# Patient Record
Sex: Male | Born: 1940 | Race: White | Hispanic: No | Marital: Married | Smoking: Former smoker
Health system: Southern US, Community
[De-identification: ages and names within clinical notes are randomized; demographics above are authoritative.]

## PROBLEM LIST (undated history)

## (undated) DIAGNOSIS — C61 Malignant neoplasm of prostate: Secondary | ICD-10-CM

## (undated) HISTORY — DX: Malignant neoplasm of prostate: C61

---
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
## 2002-03-01 ENCOUNTER — Ambulatory Visit (HOSPITAL_COMMUNITY): Admission: RE | Admit: 2002-03-01 | Discharge: 2002-03-01 | Payer: Self-pay | Admitting: Gastroenterology

## 2002-03-01 ENCOUNTER — Encounter (INDEPENDENT_AMBULATORY_CARE_PROVIDER_SITE_OTHER): Payer: Self-pay | Admitting: *Deleted

## 2007-12-01 DIAGNOSIS — C61 Malignant neoplasm of prostate: Secondary | ICD-10-CM

## 2007-12-01 HISTORY — DX: Malignant neoplasm of prostate: C61

## 2008-08-10 ENCOUNTER — Emergency Department (HOSPITAL_COMMUNITY): Admission: EM | Admit: 2008-08-10 | Discharge: 2008-08-10 | Payer: Self-pay | Admitting: Emergency Medicine

## 2008-09-18 ENCOUNTER — Ambulatory Visit: Admission: RE | Admit: 2008-09-18 | Discharge: 2008-10-02 | Payer: Self-pay | Admitting: Radiation Oncology

## 2009-12-17 ENCOUNTER — Emergency Department (HOSPITAL_COMMUNITY): Admission: EM | Admit: 2009-12-17 | Discharge: 2009-12-17 | Payer: Self-pay | Admitting: Emergency Medicine

## 2009-12-24 IMAGING — CT CT ANGIO CHEST
4 of 7 series · 13 of 30 positions shown · IV contrast (80 ml omni 300)
Comparison: None.

CLINICAL DATA: Chest pain

CT ANGIOGRAPHY CHEST
TECHNIQUE: Multidetector CT imaging of the chest using the
standard protocol during bolus administration of intravenous
contrast. Multiplanar reconstructed images obtained and reviewed to
evaluate the vascular anatomy.
Contrast: 80 ml [id number]

[Series 2: pe · axial · 0.73mm/px · z∈[-236,-64]mm · 6 of 208 slices shown]
[im 35/208  lung]
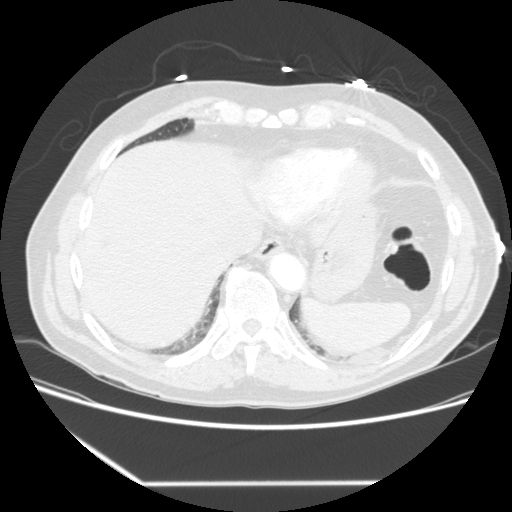
[im 70/208  mediastinal]
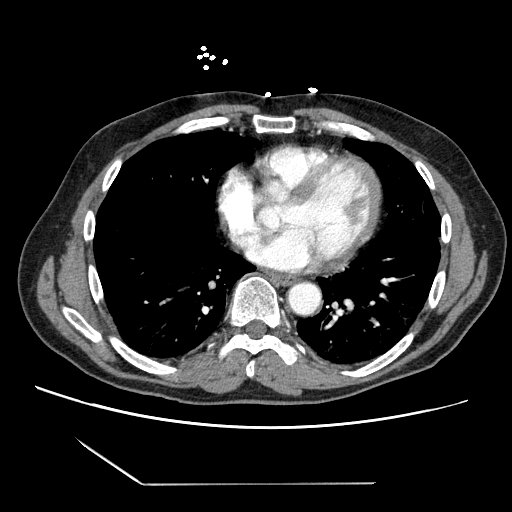
[im 104/208  lung]
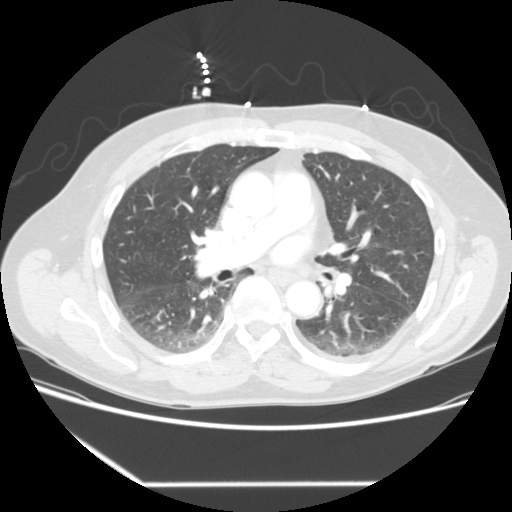
[im 121/208  mediastinal]
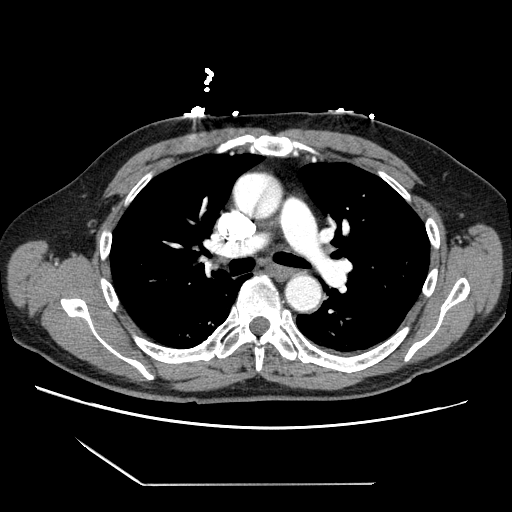
[im 139/208  lung]
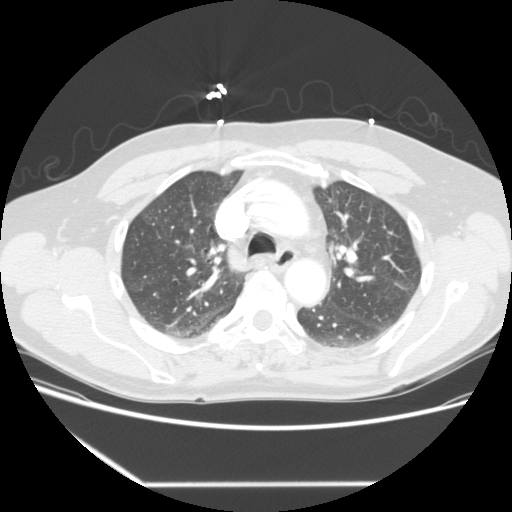
[im 173/208  mediastinal]
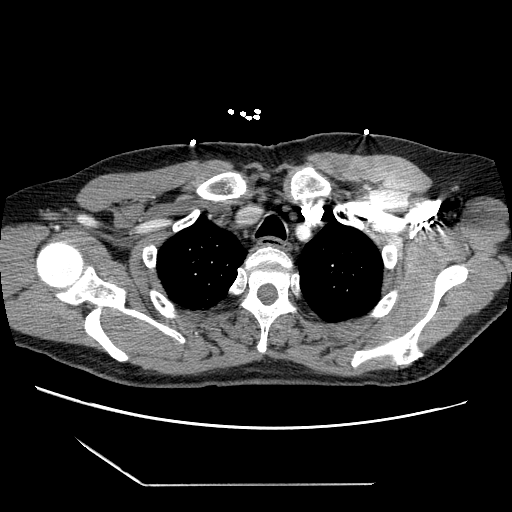

[Series 3: recon 2: pe · axial · 0.73mm/px · z∈[-192,-107]mm · 3 of 104 slices shown]
[im 35/104  lung]
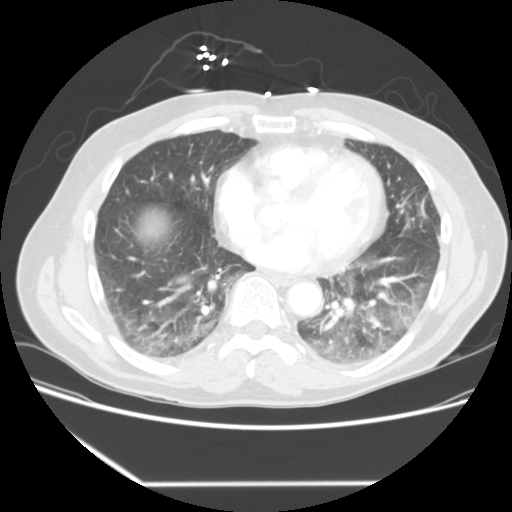
[im 61/104  lung]
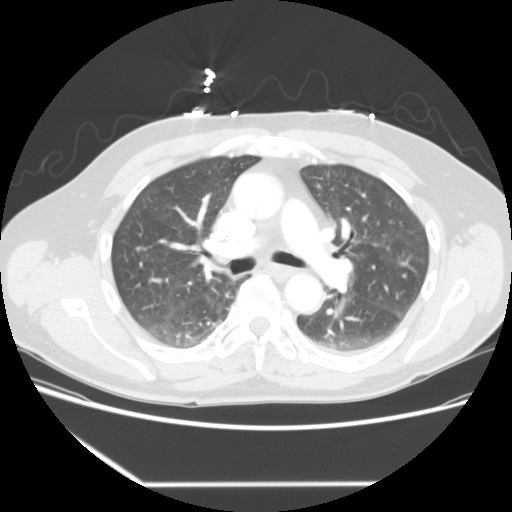
[im 69/104  lung]
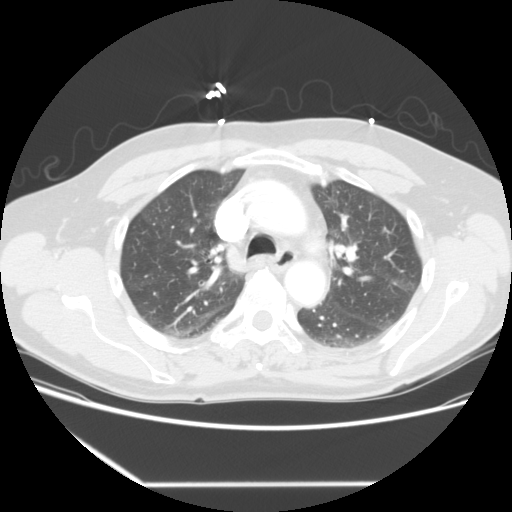

[Series 201: reformatted · sagittal · 0.73mm/px · 2 of 104 slices shown (1 of 2)]
[im 35/104  lung]
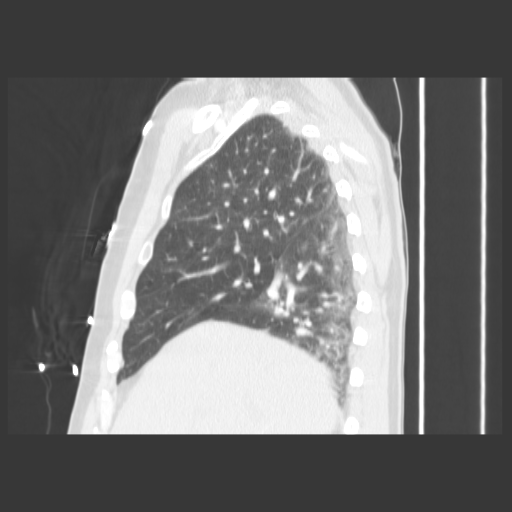
[im 69/104  lung]
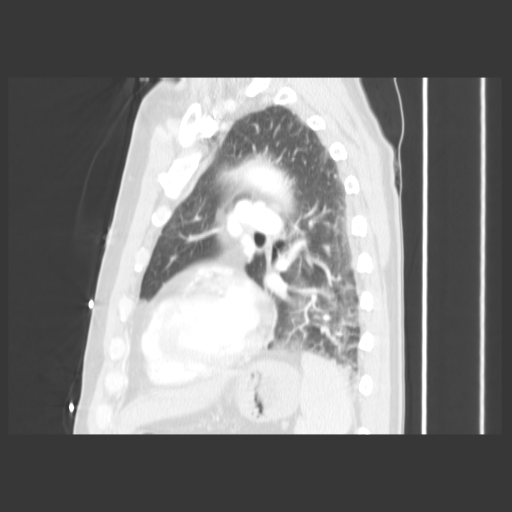

[Series 202: reformatted · coronal · 0.73mm/px · 2 of 100 slices shown (2 of 2)]
[im 34/100  lung]
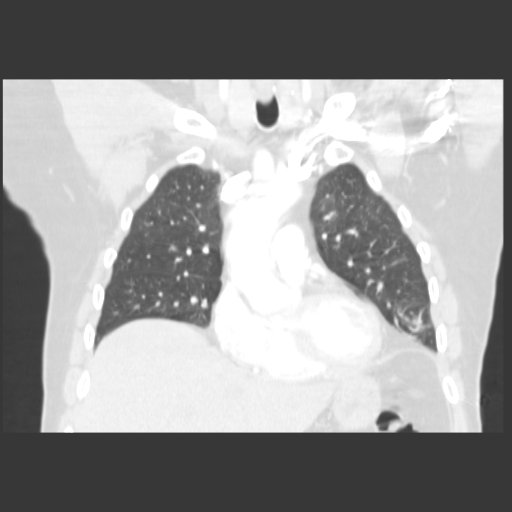
[im 67/100  lung]
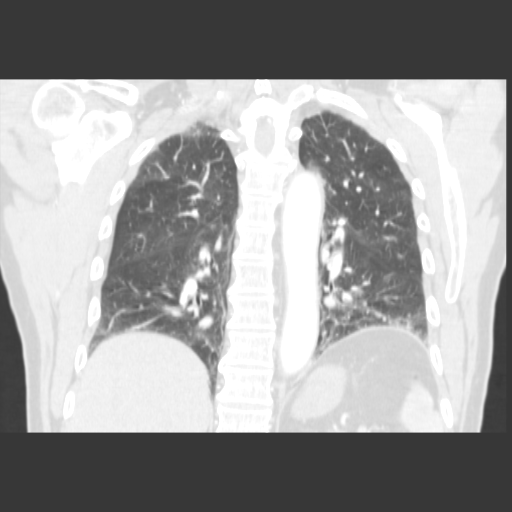

[13 of 30 positions shown; findings below may reference images not displayed]

FINDINGS: Respiratory motion in the lower lobes degrades image
quality of the subsegmental basilar pulmonary arteries.  Within
this limitation, no acute pulmonary embolus is evident.

There is no axillary, mediastinal, or hilar lymphadenopathy.
Transverse aorta measures up to about 2.8 cm in diameter.  Heart
size is normal.  There is no pericardial or pleural effusion.

Lung windows show compressive atelectasis in both lower lobes.  No
evidence for dense airspace consolidation.

Images including upper abdomen shows scattered tiny hypodensities
in the liver, too small to characterize, but probably benign
finding such as cysts.
IMPRESSION: No CT evidence for acute pulmonary embolus.

Bibasilar atelectasis without dense focal airspace consolidation.

## 2011-05-02 IMAGING — CT CT HEAD W/O CM
1 series · 16 of 30 positions shown, 20 images · non-contrast
Comparison: None

CLINICAL DATA: Blunt trauma post fall, headache

CT HEAD WITHOUT CONTRAST
TECHNIQUE: Contiguous axial images were obtained from the base of
the skull through the vertex without contrast.

[Series 2: head trauma 4.8 h37s · axial · 0.44mm/px · z∈[+1205,+1338]mm · 16 of 30 slices shown, 20 images]
[im 2/30  brain]
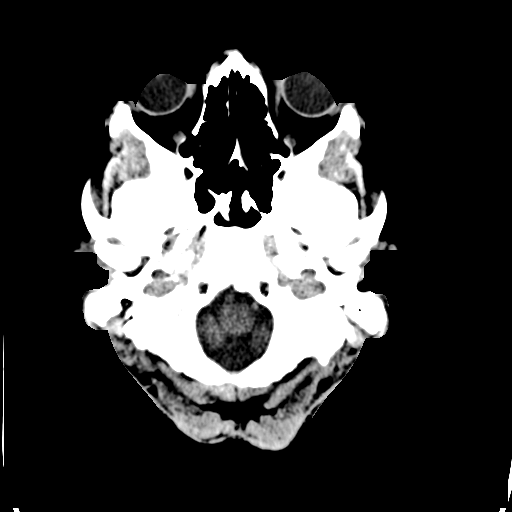
[im 2/30  bone]
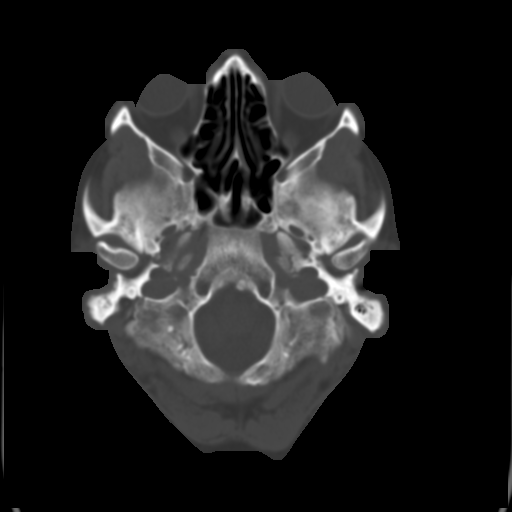
[im 4/30  brain]
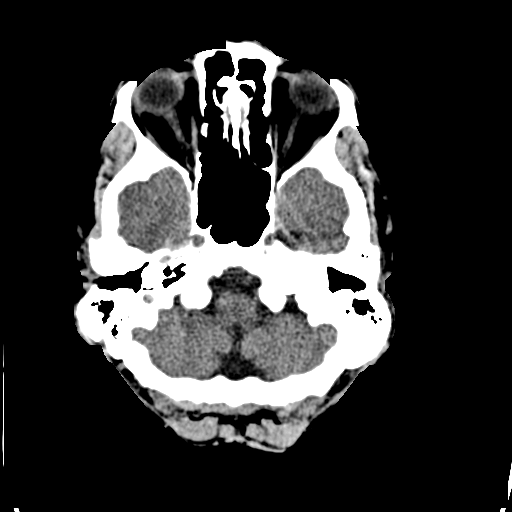
[im 6/30  brain]
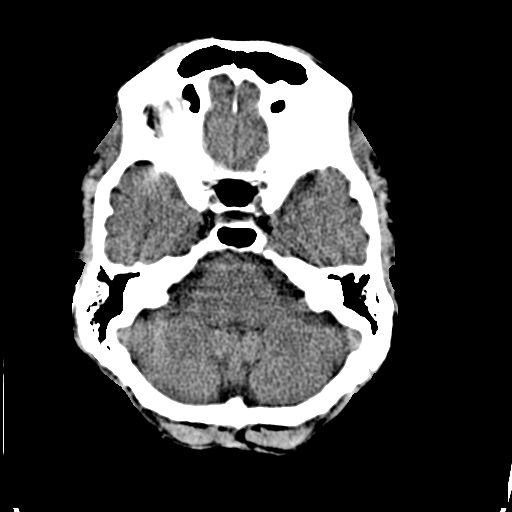
[im 8/30  brain]
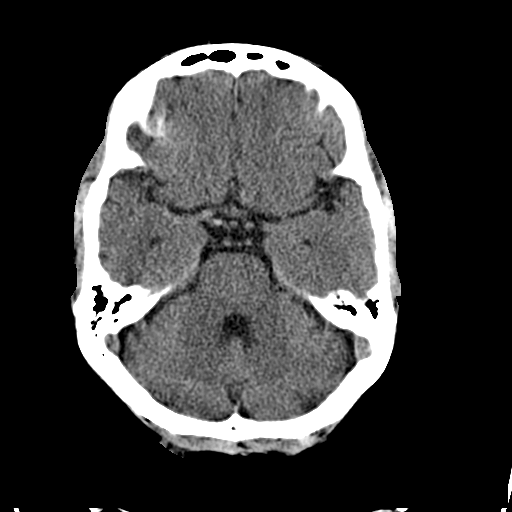
[im 9/30  brain]
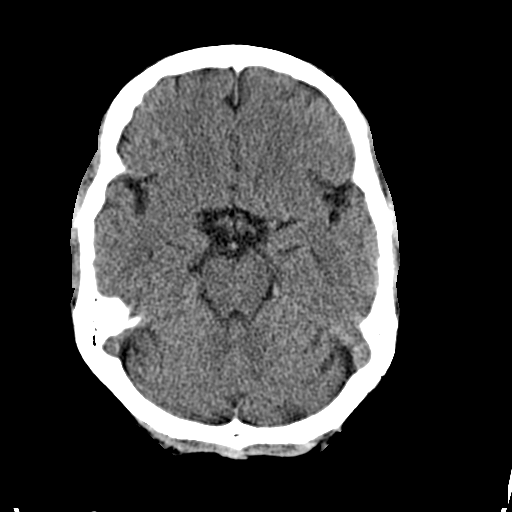
[im 9/30  bone]
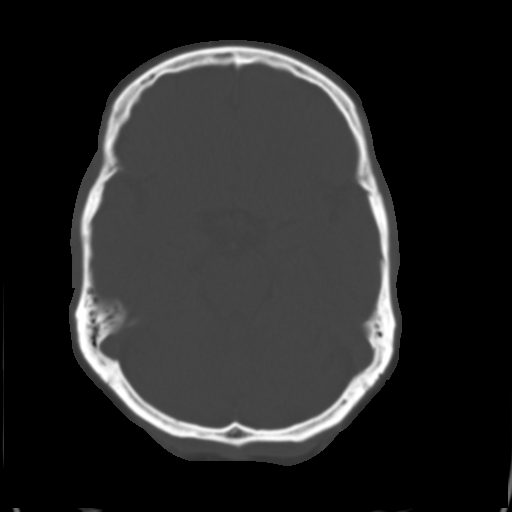
[im 11/30  brain]
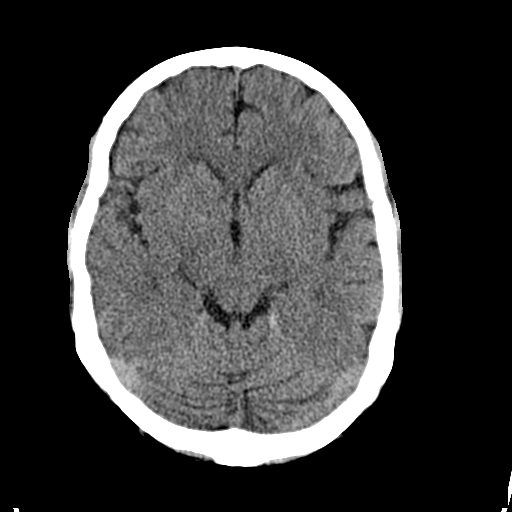
[im 13/30  brain]
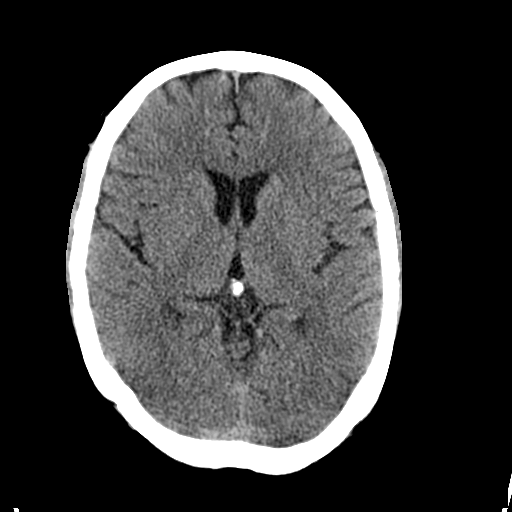
[im 15/30  brain]
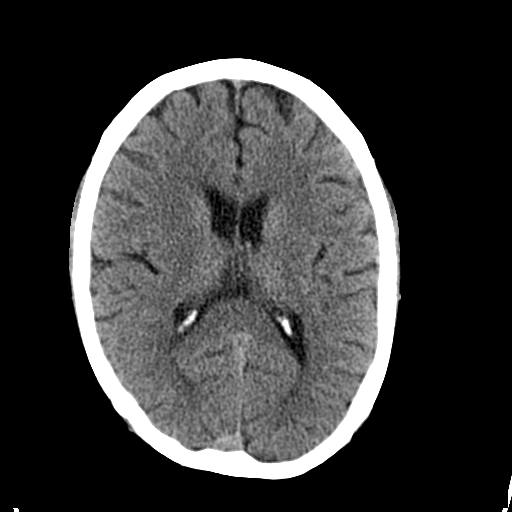
[im 16/30  brain]
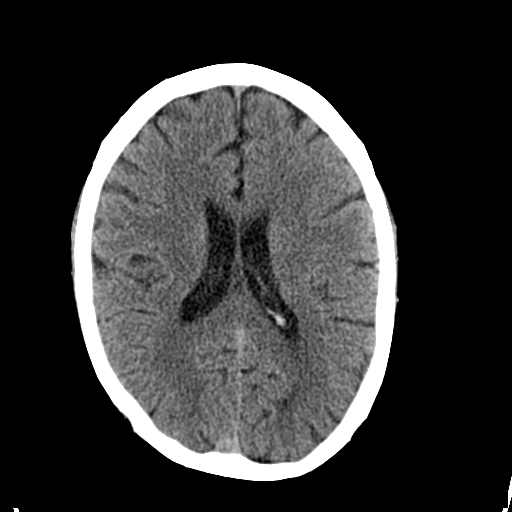
[im 16/30  bone]
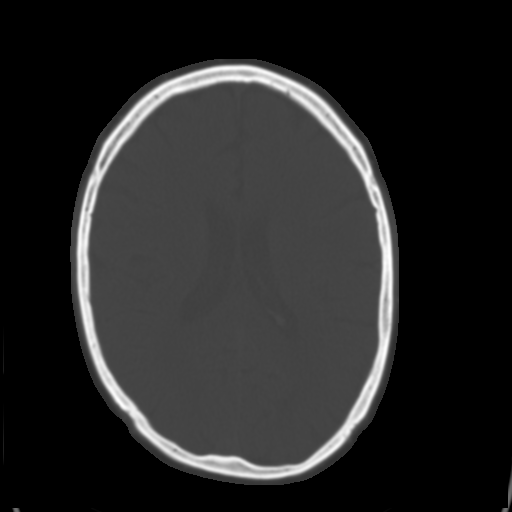
[im 18/30  brain]
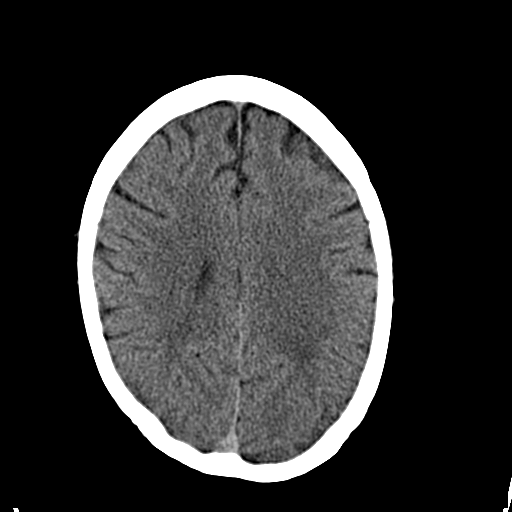
[im 20/30  brain]
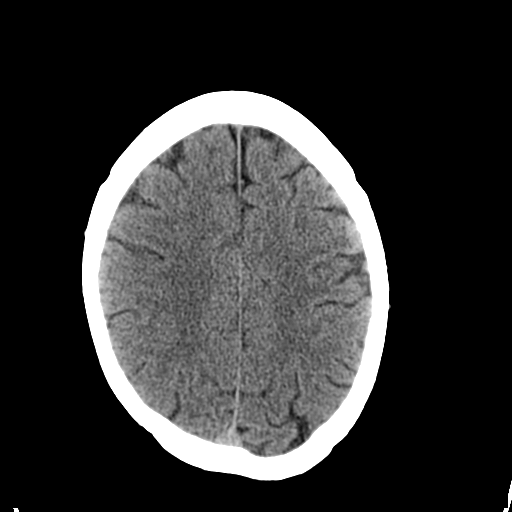
[im 22/30  brain]
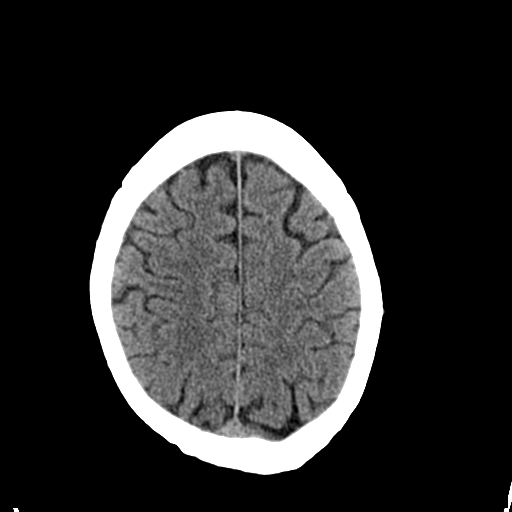
[im 23/30  brain]
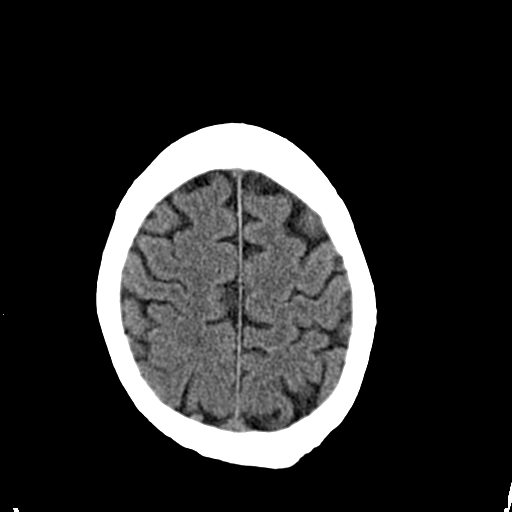
[im 23/30  bone]
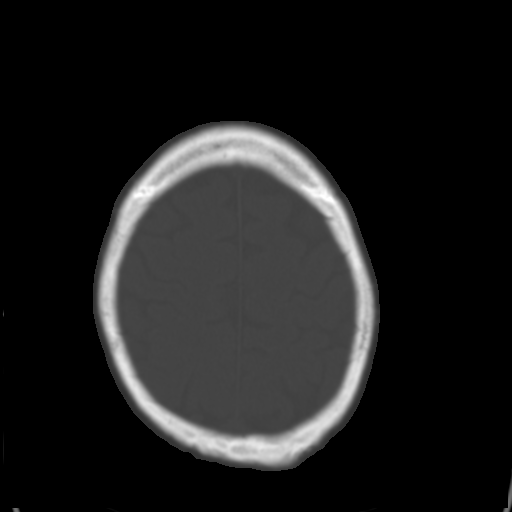
[im 25/30  brain]
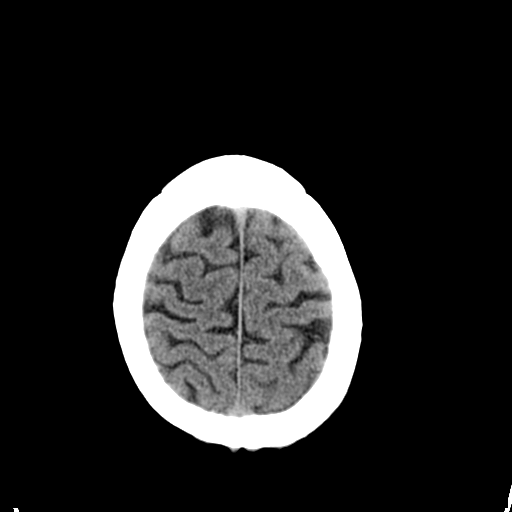
[im 27/30  brain]
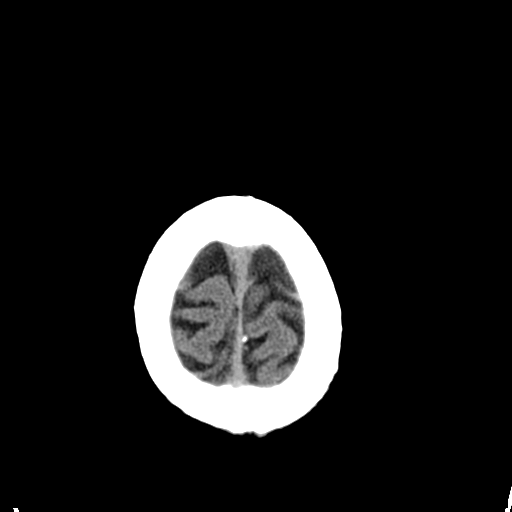
[im 29/30  brain]
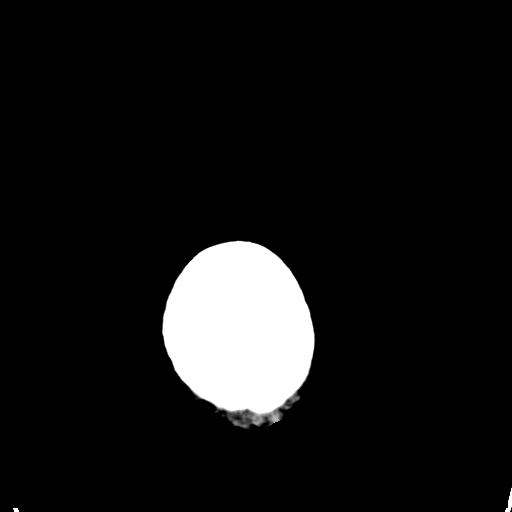

[16 of 30 positions shown; findings below may reference images not displayed]

FINDINGS: Mild atrophy. There is left posterior parietal scalp soft
tissue swelling. There is no evidence of acute intracranial
hemorrhage, brain edema, mass lesion, acute infarction,   mass
effect, or midline shift. Acute infarct may be inapparent on
noncontrast CT.  No other intra-axial abnormalities are seen, and
the ventricles and sulci are within normal limits in size and
symmetry.   No abnormal extra-axial fluid collections or masses are
identified.  No significant calvarial abnormality.
IMPRESSION: Negative for bleed or other acute intracranial process.

## 2011-09-02 LAB — DIFFERENTIAL
Basophils Absolute: 0
Eosinophils Relative: 1
Monocytes Absolute: 0.7
Neutro Abs: 6.9
Neutrophils Relative %: 76

## 2011-09-02 LAB — POCT I-STAT, CHEM 8
Calcium, Ion: 1.09 — ABNORMAL LOW
HCT: 41
Hemoglobin: 13.9
Sodium: 136
TCO2: 29

## 2011-09-02 LAB — POCT CARDIAC MARKERS
CKMB, poc: 1
CKMB, poc: <1 — ABNORMAL LOW
Myoglobin, poc: 54.6
Troponin i, poc: <0.05

## 2011-09-02 LAB — CBC
HCT: 41.2
Hemoglobin: 13.8
RDW: 12.9
WBC: 9.2

## 2012-02-15 ENCOUNTER — Emergency Department (HOSPITAL_COMMUNITY)
Admission: EM | Admit: 2012-02-15 | Discharge: 2012-02-15 | Disposition: A | Discharge status: Home / Self Care | Payer: Medicare Other | Attending: Emergency Medicine | Admitting: Emergency Medicine

## 2012-02-15 ENCOUNTER — Encounter (HOSPITAL_COMMUNITY): Payer: Self-pay

## 2012-02-15 DIAGNOSIS — IMO0001 Reserved for inherently not codable concepts without codable children: Secondary | ICD-10-CM | POA: Insufficient documentation

## 2012-02-15 DIAGNOSIS — Z79899 Other long term (current) drug therapy: Secondary | ICD-10-CM | POA: Insufficient documentation

## 2012-02-15 DIAGNOSIS — R197 Diarrhea, unspecified: Secondary | ICD-10-CM | POA: Insufficient documentation

## 2012-02-15 DIAGNOSIS — R112 Nausea with vomiting, unspecified: Secondary | ICD-10-CM | POA: Insufficient documentation

## 2012-02-15 MED ORDER — SODIUM CHLORIDE 0.9 % IV BOLUS (SEPSIS)
1000.0000 mL | Freq: Once | INTRAVENOUS | Status: AC
  Administered 2012-02-15: 1000 mL via INTRAVENOUS

## 2012-02-15 MED ORDER — ONDANSETRON HCL 4 MG/2ML IJ SOLN
4.0000 mg | Freq: Once | INTRAMUSCULAR | Status: AC
  Administered 2012-02-15: 4 mg via INTRAVENOUS
  Filled 2012-02-15: qty 2

## 2012-02-15 MED ORDER — ONDANSETRON 8 MG PO TBDP: 8.0000 mg | ORAL_TABLET | Freq: Three times a day (TID) | ORAL | Status: AC | PRN

## 2012-02-15 MED ORDER — LOPERAMIDE HCL 2 MG PO CAPS
4.0000 mg | ORAL_CAPSULE | Freq: Once | ORAL | Status: AC
  Administered 2012-02-15: 4 mg via ORAL
  Filled 2012-02-15: qty 2

## 2012-02-15 NOTE — ED Notes (Signed)
Pt states n/v/d since yesterday has not been able to eat. States feels really bad

## 2012-02-15 NOTE — ED Provider Notes (Signed)
History     CSN: [id number]  Arrival date & time 02/15/12  1003   First MD Initiated Contact with Patient 02/15/12 1023      Chief Complaint  Patient presents with  . Nausea  . Emesis  . Diarrhea    (Consider location/radiation/quality/duration/timing/severity/associated sxs/prior treatment) Patient is a 71 y.o. male presenting with vomiting and diarrhea. The history is provided by the patient.  Emesis  This is a new problem. Associated symptoms include diarrhea and myalgias. Pertinent negatives include no abdominal pain and no headaches.  Diarrhea The primary symptoms include nausea, vomiting, diarrhea and myalgias. Primary symptoms do not include abdominal pain or rash.  The myalgias are not associated with weakness.  The illness does not include back pain.   nausea vomiting diarrhea began last night. Some mild abdominal cramping. No fevers. The lightheadedness dizziness. He states he does ache all over. No clear sick contacts, but he states that he went to the [facility] basketball tournament yesterday. No blood in emesis or stool.  History reviewed. No pertinent past medical history.  History reviewed. No pertinent past surgical history.  History reviewed. No pertinent family history.  History  Substance Use Topics  . Smoking status: Not on file  . Smokeless tobacco: Not on file  . Alcohol Use: Not on file      Review of Systems  Constitutional: Negative for activity change and appetite change.  HENT: Negative for neck stiffness.   Eyes: Negative for pain.  Respiratory: Negative for chest tightness and shortness of breath.   Cardiovascular: Negative for chest pain and leg swelling.  Gastrointestinal: Positive for nausea, vomiting and diarrhea. Negative for abdominal pain.  Genitourinary: Negative for flank pain.  Musculoskeletal: Positive for myalgias. Negative for back pain.  Skin: Negative for rash.  Neurological: Negative for weakness, numbness and headaches.    Psychiatric/Behavioral: Negative for behavioral problems.    Allergies  Review of patient's allergies indicates no known allergies.  Home Medications   Current Outpatient Rx  Name Route Sig Dispense Refill  . CALCIUM CARBONATE-VITAMIN D 500-200 MG-UNIT PO TABS Oral Take 1 tablet by mouth 2 (two) times daily.    Marland Kitchen DIPHENHYDRAMINE HCL 25 MG PO CAPS Oral Take 25 mg by mouth every 6 (six) hours as needed. For sleep    . OMEGA-3 FATTY ACIDS 1000 MG PO CAPS Oral Take 2 g by mouth 2 (two) times daily.    . IBUPROFEN 200 MG PO TABS Oral Take 400 mg by mouth every 6 (six) hours as needed. For pain    . LACTASE 3000 UNITS PO TABS Oral Take 1 tablet by mouth 3 (three) times daily as needed. With milk products    . ADULT MULTIVITAMIN W/MINERALS CH Oral Take 1 tablet by mouth daily.    . OCUVITE-LUTEIN PO CAPS Oral Take 1 capsule by mouth daily.    Marland Kitchen SALINE NASAL SPRAY 0.65 % NA SOLN Nasal Place 2 sprays into the nose as needed. For dry nose    . ONDANSETRON 8 MG PO TBDP Oral Take 1 tablet (8 mg total) by mouth every 8 (eight) hours as needed for nausea. 20 tablet 0    BP 152/75  Pulse 86  Temp(Src) 97.8 F (36.6 C) (Oral)  Resp 16  SpO2 100%  Physical Exam  Nursing note and vitals reviewed. Constitutional: He is oriented to person, place, and time. He appears well-developed and well-nourished.  HENT:  Head: Normocephalic and atraumatic.  Eyes: EOM are normal. Pupils are equal,  round, and reactive to light.  Neck: Normal range of motion. Neck supple.  Cardiovascular: Normal rate, regular rhythm and normal heart sounds.   No murmur heard. Pulmonary/Chest: Effort normal and breath sounds normal.  Abdominal: Soft. Bowel sounds are normal. He exhibits no distension and no mass. There is no tenderness. There is no rebound and no guarding.  Musculoskeletal: Normal range of motion. He exhibits no edema.  Neurological: He is alert and oriented to person, place, and time. No cranial nerve  deficit.  Skin: Skin is warm and dry.  Psychiatric: He has a normal mood and affect.    ED Course  Procedures (including critical care time)  Labs Reviewed - No data to display No results found.   1. Nausea vomiting and diarrhea       MDM  Nausea vomiting diarrhea. Patient does not appear overly dehydrated. He received a liter of fluid as tolerated orals and feels better. He will be discharged home.        Juliet Rude. Rubin Payor, MD 02/15/12 [phone]

## 2012-02-15 NOTE — Discharge Instructions (Signed)
Diet for Diarrhea, Adult Having frequent, runny stools (diarrhea) has many causes. Diarrhea may be caused or worsened by food or drink. Diarrhea may be relieved by changing your diet. IF YOU ARE NOT TOLERATING SOLID FOODS:  Drink enough water and fluids to keep your urine clear or pale yellow.   Avoid sugary drinks and sodas as well as milk-based beverages.   Avoid beverages containing caffeine and alcohol.   You may try rehydrating beverages. You can make your own by following this recipe:    tsp table salt.    tsp baking soda.   ? tsp salt substitute (potassium chloride).   1 tbs + 1 tsp sugar.   1 qt water.  As your stools become more solid, you can start eating solid foods. Add foods one at a time. If a certain food causes your diarrhea to get worse, avoid that food and try other foods. A low fiber, low-fat, and lactose-free diet is recommended. Small, frequent meals may be better tolerated.  Starches  Allowed:  White, French, and pita breads, plain rolls, buns, bagels. Plain muffins, matzo. Soda, saltine, or graham crackers. Pretzels, melba toast, zwieback. Cooked cereals made with water: cornmeal, farina, cream cereals. Dry cereals: refined corn, wheat, rice. Potatoes prepared any way without skins, refined macaroni, spaghetti, noodles, refined rice.   Avoid:  Bread, rolls, or crackers made with whole wheat, multi-grains, rye, bran seeds, nuts, or coconut. Corn tortillas or taco shells. Cereals containing whole grains, multi-grains, bran, coconut, nuts, or raisins. Cooked or dry oatmeal. Coarse wheat cereals, granola. Cereals advertised as "high-fiber." Potato skins. Whole grain pasta, wild or brown rice. Popcorn. Sweet potatoes/yams. Sweet rolls, doughnuts, waffles, pancakes, sweet breads.  Vegetables  Allowed: Strained tomato and vegetable juices. Most well-cooked and canned vegetables without seeds. Fresh: Tender lettuce, cucumber without the skin, cabbage, spinach, bean  sprouts.   Avoid: Fresh, cooked, or canned: Artichokes, baked beans, beet greens, broccoli, Brussels sprouts, corn, kale, legumes, peas, sweet potatoes. Cooked: Green or red cabbage, spinach. Avoid large servings of any vegetables, because vegetables shrink when cooked, and they contain more fiber per serving than fresh vegetables.  Fruit  Allowed: All fruit juices except prune juice. Cooked or canned: Apricots, applesauce, cantaloupe, cherries, fruit cocktail, grapefruit, grapes, kiwi, mandarin oranges, peaches, pears, plums, watermelon. Fresh: Apples without skin, ripe banana, grapes, cantaloupe, cherries, grapefruit, peaches, oranges, plums. Keep servings limited to  cup or 1 piece.   Avoid: Fresh: Apple with skin, apricots, mango, pears, raspberries, strawberries. Prune juice, stewed or dried prunes. Dried fruits, raisins, dates. Large servings of all fresh fruits.  Meat and Meat Substitutes  Allowed: Ground or well-cooked tender beef, ham, veal, lamb, pork, or poultry. Eggs, plain cheese. Fish, oysters, shrimp, lobster, other seafoods. Liver, organ meats.   Avoid: Tough, fibrous meats with gristle. Peanut butter, smooth or chunky. Cheese, nuts, seeds, legumes, dried peas, beans, lentils.  Milk  Allowed: Yogurt, lactose-free milk, kefir, drinkable yogurt, buttermilk, soy milk.   Avoid: Milk, chocolate milk, beverages made with milk, such as milk shakes.  Soups  Allowed: Bouillon, broth, or soups made from allowed foods. Any strained soup.   Avoid: Soups made from vegetables that are not allowed, cream or milk-based soups.  Desserts and Sweets  Allowed: Sugar-free gelatin, sugar-free frozen ice pops made without sugar alcohol.   Avoid: Plain cakes and cookies, pie made with allowed fruit, pudding, custard, cream pie. Gelatin, fruit, ice, sherbet, frozen ice pops. Ice cream, ice milk without nuts. Plain hard candy,   honey, jelly, molasses, syrup, sugar, chocolate syrup, gumdrops,  marshmallows.  Fats and Oils  Allowed: Avoid any fats and oils.   Avoid: Seeds, nuts, olives, avocados. Margarine, butter, cream, mayonnaise, salad oils, plain salad dressings made from allowed foods. Plain gravy, crisp bacon without rind.  Beverages  Allowed: Water, decaffeinated teas, oral rehydration solutions, sugar-free beverages.   Avoid: Fruit juices, caffeinated beverages (coffee, tea, soda or pop), alcohol, sports drinks, or lemon-lime soda or pop.  Condiments  Allowed: Ketchup, mustard, horseradish, vinegar, cream sauce, cheese sauce, cocoa powder. Spices in moderation: allspice, basil, bay leaves, celery powder or leaves, cinnamon, cumin powder, curry powder, ginger, mace, marjoram, onion or garlic powder, oregano, paprika, parsley flakes, ground pepper, rosemary, sage, savory, tarragon, thyme, turmeric.   Avoid: Coconut, honey.  Weight Monitoring: Weigh yourself every day. You should weigh yourself in the morning after you urinate and before you eat breakfast. Wear the same amount of clothing when you weigh yourself. Record your weight daily. Bring your recorded weights to your clinic visits. Tell your caregiver right away if you have gained 3 lb/1.4 kg or more in 1 day, 5 lb/2.3 kg in a week, or whatever amount you were told to report. SEEK IMMEDIATE MEDICAL CARE IF:   You are unable to keep fluids down.   You start to throw up (vomit) or diarrhea keeps coming back (persistent).   Abdominal pain develops, increases, or can be felt in one place (localizes).   You have an oral temperature above 102 F (38.9 C), not controlled by medicine.   Diarrhea contains blood or mucus.   You develop excessive weakness, dizziness, fainting, or extreme thirst.  MAKE SURE YOU:   Understand these instructions.   Will watch your condition.   Will get help right away if you are not doing well or get worse.  Document Released: 02/06/2004 Document Revised: 11/05/2011 Document Reviewed:  05/30/2009 [employer] Patient Information 2012 [employer].Nausea and Vomiting Nausea is a sick feeling that often comes before throwing up (vomiting). Vomiting is a reflex where stomach contents come out of your mouth. Vomiting can cause severe loss of body fluids (dehydration). Children and elderly adults can become dehydrated quickly, especially if they also have diarrhea. Nausea and vomiting are symptoms of a condition or disease. It is important to find the cause of your symptoms. CAUSES   Direct irritation of the stomach lining. This irritation can result from increased acid production (gastroesophageal reflux disease), infection, food poisoning, taking certain medicines (such as nonsteroidal anti-inflammatory drugs), alcohol use, or tobacco use.   Signals from the brain.These signals could be caused by a headache, heat exposure, an inner ear disturbance, increased pressure in the brain from injury, infection, a tumor, or a concussion, pain, emotional stimulus, or metabolic problems.   An obstruction in the gastrointestinal tract (bowel obstruction).   Illnesses such as diabetes, hepatitis, gallbladder problems, appendicitis, kidney problems, cancer, sepsis, atypical symptoms of a heart attack, or eating disorders.   Medical treatments such as chemotherapy and radiation.   Receiving medicine that makes you sleep (general anesthetic) during surgery.  DIAGNOSIS Your caregiver may ask for tests to be done if the problems do not improve after a few days. Tests may also be done if symptoms are severe or if the reason for the nausea and vomiting is not clear. Tests may include:  Urine tests.   Blood tests.   Stool tests.   Cultures (to look for evidence of infection).   X-rays or other imaging   studies.  Test results can help your caregiver make decisions about treatment or the need for additional tests. TREATMENT You need to stay well hydrated. Drink frequently but in small  amounts.You may wish to drink water, sports drinks, clear broth, or eat frozen ice pops or gelatin dessert to help stay hydrated.When you eat, eating slowly may help prevent nausea.There are also some antinausea medicines that may help prevent nausea. HOME CARE INSTRUCTIONS   Take all medicine as directed by your caregiver.   If you do not have an appetite, do not force yourself to eat. However, you must continue to drink fluids.   If you have an appetite, eat a normal diet unless your caregiver tells you differently.   Eat a variety of complex carbohydrates (rice, wheat, potatoes, bread), lean meats, yogurt, fruits, and vegetables.   Avoid high-fat foods because they are more difficult to digest.   Drink enough water and fluids to keep your urine clear or pale yellow.   If you are dehydrated, ask your caregiver for specific rehydration instructions. Signs of dehydration may include:   Severe thirst.   Dry lips and mouth.   Dizziness.   Dark urine.   Decreasing urine frequency and amount.   Confusion.   Rapid breathing or pulse.  SEEK IMMEDIATE MEDICAL CARE IF:   You have blood or brown flecks (like coffee grounds) in your vomit.   You have black or bloody stools.   You have a severe headache or stiff neck.   You are confused.   You have severe abdominal pain.   You have chest pain or trouble breathing.   You do not urinate at least once every 8 hours.   You develop cold or clammy skin.   You continue to vomit for longer than 24 to 48 hours.   You have a fever.  MAKE SURE YOU:   Understand these instructions.   Will watch your condition.   Will get help right away if you are not doing well or get worse.  Document Released: 11/16/2005 Document Revised: 11/05/2011 Document Reviewed: 04/15/2011 [employer] Patient Information 2012 [employer]. 

## 2012-02-15 NOTE — ED Notes (Signed)
Pt states feeling better given slight amy water w/ po meds

## 2012-02-15 NOTE — ED Notes (Signed)
Here for vomiting and diarrhea since last night,

## 2013-06-13 ENCOUNTER — Ambulatory Visit (INDEPENDENT_AMBULATORY_CARE_PROVIDER_SITE_OTHER): Payer: Medicare Other | Admitting: Cardiovascular Disease

## 2013-06-13 ENCOUNTER — Encounter: Payer: Self-pay | Admitting: Cardiovascular Disease

## 2013-06-13 VITALS — BP 136/80 | HR 63 | Ht 70.5 in | Wt 174.3 lb

## 2013-06-13 DIAGNOSIS — Z01818 Encounter for other preprocedural examination: Secondary | ICD-10-CM

## 2013-06-13 DIAGNOSIS — Z9849 Cataract extraction status, unspecified eye: Secondary | ICD-10-CM | POA: Insufficient documentation

## 2013-06-13 DIAGNOSIS — Z8546 Personal history of malignant neoplasm of prostate: Secondary | ICD-10-CM | POA: Insufficient documentation

## 2013-06-13 DIAGNOSIS — R079 Chest pain, unspecified: Secondary | ICD-10-CM

## 2013-06-13 DIAGNOSIS — Z9842 Cataract extraction status, left eye: Secondary | ICD-10-CM

## 2013-06-13 MED ORDER — ISOSORBIDE MONONITRATE ER 30 MG PO TB24: 30.0000 mg | ORAL_TABLET | Freq: Every day | ORAL | Status: DC

## 2013-06-13 MED ORDER — METOPROLOL TARTRATE 25 MG PO TABS: 25.0000 mg | ORAL_TABLET | Freq: Two times a day (BID) | ORAL | Status: DC

## 2013-06-13 NOTE — Patient Instructions (Signed)
Your physician has recommended you make the following change in your medication: start Imdur, lopressor, and asiprin daily as directed. The named prescriptions has been sent to your pharmacy.  Your physician has requested that you have a cardiac catheterization. Cardiac catheterization is used to diagnose and/or treat various heart conditions. Doctors may recommend this procedure for a number of different reasons. The most common reason is to evaluate chest pain. Chest pain can be a symptom of coronary artery disease (CAD), and cardiac catheterization can show whether plaque is narrowing or blocking your heart's arteries. This procedure is also used to evaluate the valves, as well as measure the blood flow and oxygen levels in different parts of your heart. For further information please visit [email or web address] Please follow instruction sheet, as given.  .inst

## 2013-06-13 NOTE — Progress Notes (Signed)
Patient ID: Jeffery Hutchinson, male   DOB: 08/29/1941, 72 y.o.   MRN: [id number]     PATIENT PROFILE: Jeffery Hutchinson is a 72-year-old gentleman who is referred through the courtesy of Dr. John Russo for cardiology evaluation of recent development of exertional chest discomfort.   HPI:  Jeffery Hutchinson denies any known cardiac history. He is remaining fairly active. He typically exercises several days per week. Proximally one week ago, he experienced his first episode of substernal chest discomfort while on Lipitor were given exercises for 45 minutes. He felt a soreness in his chest. He stopped and ultimately his symptoms subsided in approximately 10 minutes. He did not have any recurrent symptomatology until this past Saturday when while he was doing significant upper walking in the [place] at [place] he developed substernal chest pain. This discomfort was more intense associated with diaphoresis and some shortness of breath. He ultimately walked down the mountain. He had some residual chest discomfort which lasted several hours following the episode. He has not had any further episodes of chest discomfort. He apparently saw Dr. Russo yesterday and is now seen for cardiology evaluation today.  Past Medical History  Diagnosis Date  . Prostate cancer 2009    Past Surgical History  Procedure Laterality Date  . Tonsillectomy  1961    No Known Allergies  Current Outpatient Prescriptions  Medication Sig Dispense Refill  . calcium-vitamin D (OSCAL WITH D) 500-200 MG-UNIT per tablet Take 1 tablet by mouth 2 (two) times daily.      . fish oil-omega-3 fatty acids 1000 MG capsule Take 2 g by mouth 2 (two) times daily.      . ibuprofen (ADVIL,MOTRIN) 200 MG tablet Take 400 mg by mouth every 6 (six) hours as needed. For pain      . lactase (LACTAID) 3000 UNITS tablet Take 1 tablet by mouth 3 (three) times daily as needed. With milk products      . Multiple Vitamin (MULITIVITAMIN WITH MINERALS) TABS Take  1 tablet by mouth daily.      . multivitamin-lutein (OCUVITE-LUTEIN) CAPS Take 1 capsule by mouth daily.      . sodium chloride (OCEAN) 0.65 % nasal spray Place 2 sprays into the nose as needed. For dry nose      . isosorbide mononitrate (IMDUR) 30 MG 24 hr tablet Take 1 tablet (30 mg total) by mouth daily.  30 tablet  6  . metoprolol tartrate (LOPRESSOR) 25 MG tablet Take 1 tablet (25 mg total) by mouth 2 (two) times daily.  60 tablet  6   No current facility-administered medications for this visit.    Socially he is in his second marriage for 10 years. He graduated from [employer]. 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 There is remote history of cigar use but he quit in 1983. He does drink alcohol and has approximately 10 drinks per week. He does exercise regularly typically to 4 times per week. Apparently, last week after he did the elliptical he did cut his grass and did not notice any chest discomfort.  Family History  Problem Relation Age of Onset  . Heart disease Mother 65  . Dementia Maternal Grandmother   . Hypertension Sister   . Heart disease Brother   . Thyroid disease Brother   . Heart failure Maternal Grandfather   . Heart disease Maternal Grandfather     ROS is negative for fever chills or night sweats. he   denies rashes. There is a remote remote history of having some nosebleeds and many years ago he stopped taking aspirin. He denies presyncope or syncope he presented with palpitations. He denies wheezing. As any rest chest pain. He denies any rest shortness of breath. Denies abdominal pain. He denies bleeding. He does have a history of prostate CA and underwent EP RT therapy by Dr. Robert at [facility]. He denies edema. He denies claudication. He denies paresthesias. Other system review is negative.  PE BP 136/80  Pulse 63  Ht 5' 10.5" (1.791 m)  Wt 174 lb 4.8 oz (79.062 kg)  BMI 24.65 kg/m2 General: Alert, oriented, no distress.  Skin: normal  turgor, no rashes HEENT: Normocephalic, atraumatic. Pupils round and reactive; sclera anicteric; Fundi no hemorrhages or exudates. Lens implant left eye. Nose without nasal septal hypertrophy Mouth/Parynx benign; Mallinpatti scale 2 Neck: No JVD, no carotid briuts Lungs: clear to ausculatation and percussion; no wheezing or rales No chest wall tenderness to palpation.  Heart: RRR, s1 s2 normal  no S3 or S4 gallop. Abdomen: soft, nontender; no hepatosplenomehaly, BS+; abdominal aorta nontender and not dilated by palpation. Pulses 2+;no bruits Extremities: no clubbinbg cyanosis or edema, Homan's sign negative  Neurologic: grossly nonfocal Psychological: normal affect and mood.     ECG: EKG today shows normal sinus rhythm at 63 beats per minute. Borderline first degree AV block with PR interval of 202 msec.  LABS:  BMET    Component Value Date/Time   NA 136 08/10/2008 1836   K 4.2 08/10/2008 1836   CL 102 08/10/2008 1836   GLUCOSE 100* 08/10/2008 1836   BUN 16 08/10/2008 1836   CREATININE 1.1 08/10/2008 1836     Hepatic Function Panel  No results found for this basename: prot, albumin, ast, alt, alkphos, bilitot, bilidir, ibili     CBC    Component Value Date/Time   WBC 9.2 08/10/2008 1825   RBC 4.27 08/10/2008 1825   HGB 13.9 08/10/2008 1836   HCT 41.0 08/10/2008 1836   PLT 269 08/10/2008 1825   MCV 96.7 08/10/2008 1825   MCHC 33.5 08/10/2008 1825   RDW 12.9 08/10/2008 1825   LYMPHSABS 1.4 08/10/2008 1825   MONOABS 0.7 08/10/2008 1825   EOSABS 0.1 08/10/2008 1825   BASOSABS 0.0 08/10/2008 1825     BNP No results found for this basename: probnp    Lipid Panel  No results found for this basename: chol, trig, hdl, cholhdl, vldl, ldlcalc     RADIOLOGY: No results found.   ASSESSMENT AND PLAN:  I had a long discussion with Jeffery Hutchinson and his wife. He is an active 72-year-old gentleman who is not on any medications for cardiac disease. A review of Dr. Russo's records does  indicate that he did have elevated cholesterols in the past and is not on therapy. Apparently in February 2014 Dr. Russo had checked laboratory and his total cholesterol was 220 triglycerides 110 HDL 41 LDL cholesterol 157. The patient did not want to start a statin at that time. He did experience a first episode of chest discomfort last week when he was on the elliptical for 45 minutes. He thinks that his heart rate was approximately 130 beats per minute at the time that his chest pain developed. He did not experience any further chest pains later in the week despite cutting the grass. However, with intense exercise 4 days ago while in the mountains he did experience a more significant episode of chest discomfort associated with   shortness of breath and diaphoresis and had some residual chest pain which lasted for several hours. He did not seek medical attention at that time. His ECG done yesterday in Dr. Russo's office was without acute abnormalities and his EKG today is similar. I discussed different options with the patient. With his more pronounced episode of chest pain over the weekend my recommendation was to proceed with definitive diagnostic cardiac catheterization and also discuss other options including exercise myocardial perfusion imaging stratification. Presently, I am starting him on aspirin 81 mg but told him to take 325 mg tonight. I'm starting him on Lopressor 25 mg twice a day as well as isosorbide mononitrate 30 mg daily. He was given a prescription for sublingual nitroglycerin. After discussing risks benefits of stress testing versus proceeding to definitive catheterization he prefers a definitive evaluation.. I discussed doing this  catheterization this week on Thursday in 2 days. However, he does have scheduling conflict with this week tells me he needs to be out of town this weekend. I reiterated to him the importance of not exerting himself significantly. We will initiate beta blocker therapy  to keep his heart rate control. Since I will be out of town early next week. I will schedule him for diagnostic cardiac catheterization upon my return but recommended that if he does  develop recurrent symptomatology to contact our office for scheduling sooner with one of my partners. We will repeat laboratory prior to his catheterization. I suspect he will need to initiate cholesterol-lowering medication but will defer this until his blood work becomes available for my review.   Thomas A. Kelly, MD, FACC 06/13/2013 6:13 PM 

## 2013-06-14 ENCOUNTER — Encounter: Payer: Self-pay | Admitting: Cardiovascular Disease

## 2013-06-15 ENCOUNTER — Encounter (HOSPITAL_COMMUNITY): Payer: Self-pay

## 2013-06-23 ENCOUNTER — Ambulatory Visit
Admission: RE | Admit: 2013-06-23 | Discharge: 2013-06-23 | Disposition: A | Discharge status: Home / Self Care | Payer: Medicare Other | Source: Ambulatory Visit | Attending: Cardiovascular Disease | Admitting: Cardiovascular Disease

## 2013-06-23 DIAGNOSIS — R079 Chest pain, unspecified: Secondary | ICD-10-CM

## 2013-06-23 LAB — COMPREHENSIVE METABOLIC PANEL
ALT: 15 U/L (ref 0–53)
AST: 19 U/L (ref 0–37)
Albumin: 4 g/dL (ref 3.5–5.2)
BUN: 13 mg/dL (ref 6–23)
CO2: 25 mEq/L (ref 19–32)
Calcium: 8.9 mg/dL (ref 8.4–10.5)
Chloride: 106 mEq/L (ref 96–112)
Creat: 0.92 mg/dL (ref 0.50–1.35)
Potassium: 4.3 mEq/L (ref 3.5–5.3)

## 2013-06-23 LAB — LIPID PANEL
LDL Cholesterol: 151 mg/dL — ABNORMAL HIGH (ref 0–99)
VLDL: 14 mg/dL (ref 0–40)

## 2013-06-23 LAB — CBC
HCT: 40.1 % (ref 39.0–52.0)
Hemoglobin: 13.8 g/dL (ref 13.0–17.0)
MCV: 91.8 fL (ref 78.0–100.0)
RBC: 4.37 MIL/uL (ref 4.22–5.81)
WBC: 3.7 10*3/uL — ABNORMAL LOW (ref 4.0–10.5)

## 2013-06-23 LAB — PROTIME-INR: INR: 0.92 (ref <1.50)

## 2013-06-26 ENCOUNTER — Encounter: Payer: Self-pay | Admitting: *Deleted

## 2013-06-26 NOTE — Progress Notes (Signed)
Result note sent to patient

## 2013-06-27 ENCOUNTER — Other Ambulatory Visit: Payer: Self-pay | Admitting: *Deleted

## 2013-06-27 ENCOUNTER — Encounter: Payer: Self-pay | Admitting: Cardiovascular Disease

## 2013-06-27 DIAGNOSIS — Z01818 Encounter for other preprocedural examination: Secondary | ICD-10-CM

## 2013-06-28 ENCOUNTER — Ambulatory Visit (HOSPITAL_COMMUNITY)
Admission: RE | Admit: 2013-06-28 | Discharge: 2013-06-28 | Disposition: A | Discharge status: Home / Self Care | Payer: Medicare Other | Source: Ambulatory Visit | Attending: Cardiovascular Disease | Admitting: Cardiovascular Disease

## 2013-06-28 ENCOUNTER — Encounter (HOSPITAL_COMMUNITY)
Admission: RE | Disposition: A | Discharge status: Home / Self Care | Payer: Self-pay | Source: Ambulatory Visit | Attending: Cardiovascular Disease

## 2013-06-28 ENCOUNTER — Telehealth: Payer: Self-pay | Admitting: Cardiovascular Disease

## 2013-06-28 DIAGNOSIS — Z01818 Encounter for other preprocedural examination: Secondary | ICD-10-CM

## 2013-06-28 DIAGNOSIS — Z8546 Personal history of malignant neoplasm of prostate: Secondary | ICD-10-CM | POA: Insufficient documentation

## 2013-06-28 DIAGNOSIS — Z79899 Other long term (current) drug therapy: Secondary | ICD-10-CM | POA: Insufficient documentation

## 2013-06-28 DIAGNOSIS — I209 Angina pectoris, unspecified: Secondary | ICD-10-CM | POA: Insufficient documentation

## 2013-06-28 DIAGNOSIS — E7800 Pure hypercholesterolemia, unspecified: Secondary | ICD-10-CM | POA: Insufficient documentation

## 2013-06-28 DIAGNOSIS — R079 Chest pain, unspecified: Secondary | ICD-10-CM

## 2013-06-28 DIAGNOSIS — I2510 Atherosclerotic heart disease of native coronary artery without angina pectoris: Secondary | ICD-10-CM | POA: Insufficient documentation

## 2013-06-28 HISTORY — PX: LEFT HEART CATHETERIZATION WITH CORONARY ANGIOGRAM: SHX5451

## 2013-06-28 SURGERY — LEFT HEART CATHETERIZATION WITH CORONARY ANGIOGRAM
Anesthesia: LOCAL

## 2013-06-28 MED ORDER — SODIUM CHLORIDE 0.9 % IV SOLN
250.0000 mL | INTRAVENOUS | Status: DC | PRN
Start: 2013-06-28 — End: 2013-06-28

## 2013-06-28 MED ORDER — ATORVASTATIN CALCIUM 40 MG PO TABS: 40.0000 mg | ORAL_TABLET | Freq: Every day | ORAL | Status: DC

## 2013-06-28 MED ORDER — SODIUM CHLORIDE 0.9 % IJ SOLN: 3.0000 mL | Freq: Two times a day (BID) | INTRAMUSCULAR | Status: DC

## 2013-06-28 MED ORDER — CALCIUM CARBONATE-VITAMIN D 500-200 MG-UNIT PO TABS: 1.0000 | ORAL_TABLET | Freq: Two times a day (BID) | ORAL | Status: DC

## 2013-06-28 MED ORDER — DIAZEPAM 5 MG PO TABS
5.0000 mg | ORAL_TABLET | ORAL | Status: AC
  Administered 2013-06-28: 5 mg via ORAL
  Filled 2013-06-28: qty 1

## 2013-06-28 MED ORDER — ADULT MULTIVITAMIN W/MINERALS CH: 1.0000 | ORAL_TABLET | Freq: Every day | ORAL | Status: DC

## 2013-06-28 MED ORDER — ASPIRIN 81 MG PO TABS: 81.0000 mg | ORAL_TABLET | Freq: Every day | ORAL | Status: DC

## 2013-06-28 MED ORDER — DEXTROSE-NACL 5-0.45 % IV SOLN: INTRAVENOUS | Status: DC

## 2013-06-28 MED ORDER — ASPIRIN 81 MG PO CHEW
324.0000 mg | CHEWABLE_TABLET | ORAL | Status: AC
  Administered 2013-06-28: 324 mg via ORAL
  Filled 2013-06-28: qty 4

## 2013-06-28 MED ORDER — MIDAZOLAM HCL 2 MG/2ML IJ SOLN
INTRAMUSCULAR | Status: AC
  Filled 2013-06-28: qty 2

## 2013-06-28 MED ORDER — SODIUM CHLORIDE 0.9 % IJ SOLN: 3.0000 mL | INTRAMUSCULAR | Status: DC | PRN

## 2013-06-28 MED ORDER — LIDOCAINE HCL (PF) 1 % IJ SOLN
INTRAMUSCULAR | Status: AC
  Filled 2013-06-28: qty 30

## 2013-06-28 MED ORDER — ONDANSETRON HCL 4 MG/2ML IJ SOLN: 4.0000 mg | Freq: Four times a day (QID) | INTRAMUSCULAR | Status: DC | PRN

## 2013-06-28 MED ORDER — ISOSORBIDE MONONITRATE ER 30 MG PO TB24: 30.0000 mg | ORAL_TABLET | Freq: Every day | ORAL | Status: DC

## 2013-06-28 MED ORDER — ACETAMINOPHEN 325 MG PO TABS: 650.0000 mg | ORAL_TABLET | ORAL | Status: DC | PRN

## 2013-06-28 MED ORDER — FENTANYL CITRATE 0.05 MG/ML IJ SOLN
INTRAMUSCULAR | Status: AC
  Filled 2013-06-28: qty 2

## 2013-06-28 MED ORDER — HEPARIN (PORCINE) IN NACL 2-0.9 UNIT/ML-% IJ SOLN
INTRAMUSCULAR | Status: AC
  Filled 2013-06-28: qty 1000

## 2013-06-28 MED ORDER — SODIUM CHLORIDE 0.9 % IV SOLN: INTRAVENOUS | Status: DC

## 2013-06-28 MED ORDER — METOPROLOL TARTRATE 12.5 MG HALF TABLET: 25.0000 mg | ORAL_TABLET | Freq: Two times a day (BID) | ORAL | Status: DC

## 2013-06-28 MED ORDER — SODIUM CHLORIDE 0.9 % IV SOLN
INTRAVENOUS | Status: DC
  Administered 2013-06-28: 08:00:00 via INTRAVENOUS

## 2013-06-28 NOTE — H&P (View-Only) (Signed)
Patient ID: Jeffery Hutchinson, male   DOB: [birth year], 72 y.o.   MRN: [id number]     PATIENT PROFILE: Mr. Kizer Nobbe is a 72 year old gentleman who is referred through the courtesy of Dr. Creola Corn for cardiology evaluation of recent development of exertional chest discomfort.   HPI:  Mr. Stracener denies any known cardiac history. He is remaining fairly active. He typically exercises several days per week. Proximally one week ago, he experienced his first episode of substernal chest discomfort while on Lipitor were given exercises for 45 minutes. He felt a soreness in his chest. He stopped and ultimately his symptoms subsided in approximately 10 minutes. He did not have any recurrent symptomatology until this past Saturday when while he was doing significant upper walking in the mountains at [facility] he developed substernal chest pain. This discomfort was more intense associated with diaphoresis and some shortness of breath. He ultimately walked down the mountain. He had some residual chest discomfort which lasted several hours following the episode. He has not had any further episodes of chest discomfort. He apparently saw Dr. Timothy Lasso yesterday and is now seen for cardiology evaluation today.  Past Medical History  Diagnosis Date  . Prostate cancer 2009    Past Surgical History  Procedure Laterality Date  . Tonsillectomy  1961    No Known Allergies  Current Outpatient Prescriptions  Medication Sig Dispense Refill  . calcium-vitamin D (OSCAL WITH D) 500-200 MG-UNIT per tablet Take 1 tablet by mouth 2 (two) times daily.      . fish oil-omega-3 fatty acids 1000 MG capsule Take 2 g by mouth 2 (two) times daily.      Marland Kitchen ibuprofen (ADVIL,MOTRIN) 200 MG tablet Take 400 mg by mouth every 6 (six) hours as needed. For pain      . lactase (LACTAID) 3000 UNITS tablet Take 1 tablet by mouth 3 (three) times daily as needed. With milk products      . Multiple Vitamin (MULITIVITAMIN WITH MINERALS) TABS Take  1 tablet by mouth daily.      . multivitamin-lutein (OCUVITE-LUTEIN) CAPS Take 1 capsule by mouth daily.      . sodium chloride (OCEAN) 0.65 % nasal spray Place 2 sprays into the nose as needed. For dry nose      . isosorbide mononitrate (IMDUR) 30 MG 24 hr tablet Take 1 tablet (30 mg total) by mouth daily.  30 tablet  6  . metoprolol tartrate (LOPRESSOR) 25 MG tablet Take 1 tablet (25 mg total) by mouth 2 (two) times daily.  60 tablet  6   No current facility-administered medications for this visit.    Socially he is in his second marriage for 10 years. He graduated from [employer]. 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 There is remote history of cigar use but he quit in 1983. He does drink alcohol and has approximately 10 drinks per week. He does exercise regularly typically to 4 times per week. Apparently, last week after he did the elliptical he did cut his grass and did not notice any chest discomfort.  Family History  Problem Relation Age of Onset  . Heart disease Mother 44  . Dementia Maternal Grandmother   . Hypertension Sister   . Heart disease Brother   . Thyroid disease Brother   . Heart failure Maternal Grandfather   . Heart disease Maternal Grandfather     ROS is negative for fever chills or night sweats. he  denies rashes. There is a remote remote history of having some nosebleeds and many years ago he stopped taking aspirin. He denies presyncope or syncope he presented with palpitations. He denies wheezing. As any rest chest pain. He denies any rest shortness of breath. Denies abdominal pain. He denies bleeding. He does have a history of prostate CA and underwent EP RT therapy by Dr. Molly Maduro at [facility]. He denies edema. He denies claudication. He denies paresthesias. Other system review is negative.  PE BP 136/80  Pulse 63  Ht 5' 10.5" (1.791 m)  Wt 174 lb 4.8 oz (79.062 kg)  BMI 24.65 kg/m2 General: Alert, oriented, no distress.  Skin: normal  turgor, no rashes HEENT: Normocephalic, atraumatic. Pupils round and reactive; sclera anicteric; Fundi no hemorrhages or exudates. Lens implant left eye. Nose without nasal septal hypertrophy Mouth/Parynx benign; Mallinpatti scale 2 Neck: No JVD, no carotid briuts Lungs: clear to ausculatation and percussion; no wheezing or rales No chest wall tenderness to palpation.  Heart: RRR, s1 s2 normal  no S3 or S4 gallop. Abdomen: soft, nontender; no hepatosplenomehaly, BS+; abdominal aorta nontender and not dilated by palpation. Pulses 2+;no bruits Extremities: no clubbinbg cyanosis or edema, Homan's sign negative  Neurologic: grossly nonfocal Psychological: normal affect and mood.     ECG: EKG today shows normal sinus rhythm at 63 beats per minute. Borderline first degree AV block with PR interval of 202 msec.  LABS:  BMET    Component Value Date/Time   NA 136 08/10/2008 1836   K 4.2 08/10/2008 1836   CL 102 08/10/2008 1836   GLUCOSE 100* 08/10/2008 1836   BUN 16 08/10/2008 1836   CREATININE 1.1 08/10/2008 1836     Hepatic Function Panel  No results found for this basename: prot, albumin, ast, alt, alkphos, bilitot, bilidir, ibili     CBC    Component Value Date/Time   WBC 9.2 08/10/2008 1825   RBC 4.27 08/10/2008 1825   HGB 13.9 08/10/2008 1836   HCT 41.0 08/10/2008 1836   PLT 269 08/10/2008 1825   MCV 96.7 08/10/2008 1825   MCHC 33.5 08/10/2008 1825   RDW 12.9 08/10/2008 1825   LYMPHSABS 1.4 08/10/2008 1825   MONOABS 0.7 08/10/2008 1825   EOSABS 0.1 08/10/2008 1825   BASOSABS 0.0 08/10/2008 1825     BNP No results found for this basename: probnp    Lipid Panel  No results found for this basename: chol, trig, hdl, cholhdl, vldl, ldlcalc     RADIOLOGY: No results found.   ASSESSMENT AND PLAN:  I had a long discussion with Mr. Voorhis and his wife. He is an active 72 year old gentleman who is not on any medications for cardiac disease. A review of Dr. Ferd Hibbs records does  indicate that he did have elevated cholesterols in the past and is not on therapy. Apparently in February 2014 Dr. Timothy Lasso had checked laboratory and his total cholesterol was 220 triglycerides 110 HDL 41 LDL cholesterol 157. The patient did not want to start a statin at that time. He did experience a first episode of chest discomfort last week when he was on the elliptical for 45 minutes. He thinks that his heart rate was approximately 130 beats per minute at the time that his chest pain developed. He did not experience any further chest pains later in the week despite cutting the grass. However, with intense exercise 4 days ago while in the mountains he did experience a more significant episode of chest discomfort associated with  shortness of breath and diaphoresis and had some residual chest pain which lasted for several hours. He did not seek medical attention at that time. His ECG done yesterday in Dr. Ferd Hibbs office was without acute abnormalities and his EKG today is similar. I discussed different options with the patient. With his more pronounced episode of chest pain over the weekend my recommendation was to proceed with definitive diagnostic cardiac catheterization and also discuss other options including exercise myocardial perfusion imaging stratification. Presently, I am starting him on aspirin 81 mg but told him to take 325 mg tonight. I'm starting him on Lopressor 25 mg twice a day as well as isosorbide mononitrate 30 mg daily. He was given a prescription for sublingual nitroglycerin. After discussing risks benefits of stress testing versus proceeding to definitive catheterization he prefers a definitive evaluation.. I discussed doing this  catheterization this week on Thursday in 2 days. However, he does have scheduling conflict with this week tells me he needs to be out of town this weekend. I reiterated to him the importance of not exerting himself significantly. We will initiate beta blocker therapy  to keep his heart rate control. Since I will be out of town early next week. I will schedule him for diagnostic cardiac catheterization upon my return but recommended that if he does  develop recurrent symptomatology to contact our office for scheduling sooner with one of my partners. We will repeat laboratory prior to his catheterization. I suspect he will need to initiate cholesterol-lowering medication but will defer this until his blood work becomes available for my review.   Lennette Bihari, MD, [facility] 06/13/2013 6:13 PM

## 2013-06-28 NOTE — Interval H&P Note (Signed)
Cath Lab Visit (complete for each Cath Lab visit)  Clinical Evaluation Leading to the Procedure:   ACS: no  Non-ACS:    Anginal Classification: Class II  Anti-ischemic medical therapy: Maximal Therapy (2 or more classes of medications)  Non-Invasive Test Results: No non-invasive testing performed  Prior CABG: No previous CABG      History and Physical Interval Note:  06/28/2013 8:22 AM  Jeffery Hutchinson  has presented today for surgery, with the diagnosis of cad  The various methods of treatment have been discussed with the patient and family. After consideration of risks, benefits and other options for treatment, the patient has consented to  Procedure(s): LEFT HEART CATHETERIZATION WITH CORONARY ANGIOGRAM (N/A) as a surgical intervention .  The patient's history has been reviewed, patient examined, no change in status, stable for surgery.  I have reviewed the patient's chart and labs.  Questions were answered to the patient's satisfaction.     KELLY,THOMAS A

## 2013-06-28 NOTE — Telephone Encounter (Signed)
Response from B. Hager, PA-C and informed pt is still at the hospital.  Call not returned to pt.

## 2013-06-28 NOTE — Telephone Encounter (Signed)
Had Cath this morning-Dr Tresa Endo told him he was going to change his medicine-did not give him any prescriptions or further instructions-need to know what to do.

## 2013-06-28 NOTE — Telephone Encounter (Signed)
Page to B. Hager, PA-C.  Unable to locate discharge instructions.

## 2013-06-28 NOTE — Interval H&P Note (Signed)
History and Physical Interval Note:  06/28/2013 8:09 AM  Jeffery Hutchinson  has presented today for surgery, with the diagnosis of cad  The various methods of treatment have been discussed with the patient and family. After consideration of risks, benefits and other options for treatment, the patient has consented to  Procedure(s): LEFT HEART CATHETERIZATION WITH CORONARY ANGIOGRAM (N/A) as a surgical intervention .  The patient's history has been reviewed, patient examined, no change in status, stable for surgery.  I have reviewed the patient's chart and labs.  Questions were answered to the patient's satisfaction.     [name]

## 2013-07-03 NOTE — CV Procedure (Signed)
Jeffery Hutchinson is a 72 y.o. male    [id number]  [id number] LOCATION:  FACILITY: [facility]  PHYSICIAN: Lennette Bihari, MD, [facility] 08-29-1941   DATE OF PROCEDURE:  06/28/2013    [facility]  CARDIAC CATHETERIZATION     HISTORY: [name] is a 72 year old gentleman recently developed exertionally precipitated substernal chest tightness. The first occurred while he was on an elliptical exercise machine and the second occurred when he was walking in the mountains he was seen initially in the office and at that time was started on medical therapy consisting of beta blocker and nitrates. On medical therapy he denies recurrent symptoms but he has not exert himself. He says to the office today for definitive diagnostic cardiac catheterization. Of note, the patient also has a history of mild hyperlipidemia and in the past has not been treated.   PROCEDURE:  The patient was brought to the second floor [facility] cath lab in the postabsorptive state. He was premedicated with Versed 2 mg and fentanyl 50 mcg. His  right groin was prepped and shaved in usual sterile fashion. Xylocaine 1% was used for local anesthesia. A 5 French sheath was inserted into the right femoral artery. Diagnostic catheterizatiion was done with  5 [place] LF4, FR4, and pigtail catheters. Left ventriculography was done with 25 cc Omnipaque contrast. Hemostasis was obtained by direct manual compression. The patient tolerated the procedure well.   HEMODYNAMICS:   Central Aorta: 152/72   Left Ventricle: 152/16  ANGIOGRAPHY:  1. Left main: normal 2. LAD: 30 - 50% mid smooth stenosis 3. Ramus: normal 4. Left circumflex: 60 - 70% proximal stenosis before marginal vessel; 40 - 50% stenosis in marginal  5. Right coronary artery: 20 - 30% proximal stenosis, 20 % distal stenosis  6. Biplane left ventriculography revealed normal LV function. Ejection fraction 55%. There were no wall motion  abnormalities. There was no evidence for mitral regurgitation.   IMPRESSION:  Normal LV function Moderate coronary obstructive disease with 30-50% smooth narrowing in the mid LAD, 60-70% narrowing in the proximal circumflex with 40-50% stenosis in the circumflex marginal vessel, and 20% proximal and distal RCA stenoses.  DISCUSSION:  [name] has recently developed exertional angina symptomatology and his most significant episode occurring while he was walking up the mountains. Initially, he had class II symptoms. However, since initiation of medical therapy he has been essentially pain-free although his exercise level has been limited. The patient has recently been demonstrated to be hyperlipidemic. Present, plans will be to initiate statin therapy in an attempt to induce plaque regression. Medical therapy will be titrated. On medical therapy and with potential for statin induced plaque stability and potential regression, he  will be referred for an exercise Myoview study further evaluate potential for ischemia. I suspect his culprit vessel is the left circumflex vessel and if on medical therapy he develops recurrent symptomatology and has documented ischemia consideration for elective PCI to the circumflex will be discussed. Hopefully, with continued medical therapy and initiation of aggressive statin therapy he will have plaque regression and remained asymptomatic.     Lennette Bihari, MD, [facility] 07/03/2013 5:32 PM

## 2013-07-11 ENCOUNTER — Telehealth: Payer: Self-pay | Admitting: Cardiovascular Disease

## 2013-07-11 NOTE — Telephone Encounter (Signed)
Please call-question about whether he need to continue take these medicine.

## 2013-07-11 NOTE — Telephone Encounter (Signed)
Returned call.  Pt stated he is almost out of two of the medicines Dr. Tresa Endo Rx'd and wanted to know if he needs to keep taking them or stop once he's out.  Asked pt if he was referring to metoprolol and isosorbide and pt stated he was.  Pt advised to continue taking both and informed he has 6 refills on both.  Pt verbalized understanding and agreed w/ plan.

## 2013-07-26 ENCOUNTER — Encounter: Payer: Self-pay | Admitting: Cardiovascular Disease

## 2013-07-26 ENCOUNTER — Ambulatory Visit (INDEPENDENT_AMBULATORY_CARE_PROVIDER_SITE_OTHER): Payer: Medicare Other | Admitting: Cardiovascular Disease

## 2013-07-26 VITALS — BP 138/88 | HR 57 | Ht 71.0 in | Wt 178.7 lb

## 2013-07-26 DIAGNOSIS — Z79899 Other long term (current) drug therapy: Secondary | ICD-10-CM

## 2013-07-26 DIAGNOSIS — E785 Hyperlipidemia, unspecified: Secondary | ICD-10-CM

## 2013-07-26 DIAGNOSIS — E782 Mixed hyperlipidemia: Secondary | ICD-10-CM

## 2013-07-26 DIAGNOSIS — I2510 Atherosclerotic heart disease of native coronary artery without angina pectoris: Secondary | ICD-10-CM

## 2013-07-26 NOTE — Patient Instructions (Addendum)
Your physician recommends that you return for lab work in: September fasting.  Your physician recommends that you schedule a follow-up appointment in: OCTOBER

## 2013-08-21 LAB — COMPREHENSIVE METABOLIC PANEL
ALT: 24 U/L (ref 0–53)
CO2: 29 mEq/L (ref 19–32)
Calcium: 9.5 mg/dL (ref 8.4–10.5)
Chloride: 102 mEq/L (ref 96–112)
Creat: 0.98 mg/dL (ref 0.50–1.35)
Glucose, Bld: 100 mg/dL — ABNORMAL HIGH (ref 70–99)
Total Bilirubin: 0.7 mg/dL (ref 0.3–1.2)

## 2013-08-22 ENCOUNTER — Encounter: Payer: Self-pay | Admitting: Cardiovascular Disease

## 2013-08-22 LAB — NMR LIPOPROFILE WITH LIPIDS
Cholesterol, Total: 134 mg/dL (ref <200)
HDL Particle Number: 39.3 umol/L (ref >=30.5)
LDL Size: 20.9 nm (ref >20.5)
Large HDL-P: 7.5 umol/L (ref >=4.8)
Large VLDL-P: 1.5 nmol/L (ref <=2.7)
Small LDL Particle Number: 550 nmol/L — ABNORMAL HIGH (ref <=527)

## 2013-08-23 ENCOUNTER — Encounter: Payer: Self-pay | Admitting: Cardiovascular Disease

## 2013-08-23 DIAGNOSIS — I2510 Atherosclerotic heart disease of native coronary artery without angina pectoris: Secondary | ICD-10-CM | POA: Insufficient documentation

## 2013-08-23 DIAGNOSIS — E785 Hyperlipidemia, unspecified: Secondary | ICD-10-CM | POA: Insufficient documentation

## 2013-08-23 NOTE — Progress Notes (Signed)
Patient ID: Jeffery Hutchinson, male   DOB: 04/19/1941, 72 y.o.   MRN: [id number]     HPI: Jeffery Hutchinson, is a 72 y.o. male who presents to the office today in followup of his recent cardiac catheterization.  Jeffery Hutchinson is a very pleasant 72 year old gentleman with recently developed episodes of exertionally precipitated substernal chest tightness. The first episode occurred will he was on the elliptical exercise machine and the second at occurred [place]. I saw him for initial cardiology evaluation on 06/13/2013. I was concerned that his symptoms were representative of angina. I initiated medical therapy with Lopressor 25 mg twice a day as as well as isosorbide mononitrate in addition to aspirin. Because of his fairly classic exertional symptomatology I did recommend definitive cardiac catheterization.  Catheterization was performed on 06/28/2013. This revealed normal LV function. He did have moderate coronary obstructive disease with 30-50% smooth narrowing in the LAD, 60-70% narrowing in the proximal circumflex with a 40-50% stenosis in the circumflex marginal vessel and the right coronary artery had 20% proximal and distal stenoses. My suspicion was that his culprit vessel was the circumflex vessel and he had not had any further chest pain since initiating medication. I initiated lipid-lowering therapy. He presents now for evaluation. Since he has been on medical therapy, he denies any recurrent chest pain episodes. He presents for reevaluation.   Past Medical History  Diagnosis Date  . Prostate cancer 2009    Past Surgical History  Procedure Laterality Date  . Tonsillectomy  1961    No Known Allergies  Current Outpatient Prescriptions  Medication Sig Dispense Refill  . aspirin 81 MG tablet Take 81 mg by mouth daily.      Marland Kitchen atorvastatin (LIPITOR) 40 MG tablet Take 1 tablet (40 mg total) by mouth daily at 6 PM.  30 tablet  5  . calcium-vitamin D (OSCAL WITH D)  500-200 MG-UNIT per tablet Take 1 tablet by mouth 2 (two) times daily.      . fish oil-omega-3 fatty acids 1000 MG capsule Take 2 g by mouth 2 (two) times daily.      Marland Kitchen ibuprofen (ADVIL,MOTRIN) 200 MG tablet Take 400 mg by mouth every 6 (six) hours as needed. For pain      . isosorbide mononitrate (IMDUR) 30 MG 24 hr tablet Take 1 tablet (30 mg total) by mouth daily.  30 tablet  6  . lactase (LACTAID) 3000 UNITS tablet Take 1 tablet by mouth 3 (three) times daily as needed. With milk products      . metoprolol tartrate (LOPRESSOR) 25 MG tablet Take 1 tablet (25 mg total) by mouth 2 (two) times daily.  60 tablet  6  . Multiple Vitamin (MULITIVITAMIN WITH MINERALS) TABS Take 1 tablet by mouth daily.      . sodium chloride (OCEAN) 0.65 % nasal spray Place 2 sprays into the nose as needed. For dry nose       No current facility-administered medications for this visit.    History   Social History  . Marital Status: Married    Spouse Name: N/A    Number of Children: N/A  . Years of Education: N/A   Occupational History  . Not on file.   Social History Main Topics  . Smoking status: Former Games developer  . Smokeless tobacco: Former Neurosurgeon    Quit date: 11/30/1981  . Alcohol Use: 6 - 7 oz/week    12-14 drink(s) per week  .  Drug Use: Not on file  . Sexual Activity: Not on file   Other Topics Concern  . Not on file   Social History Narrative  . No narrative on file    Family History  Problem Relation Age of Onset  . Heart disease Mother 15  . Dementia Maternal Grandmother   . Hypertension Sister   . Heart disease Brother   . Thyroid disease Brother   . Heart failure Maternal Grandfather   . Heart disease Maternal Grandfather     ROS is negative for fevers, chills or night sweats.  He denies palpitations. He denies presyncope or syncope. There is no chest pain since she's been started with medical therapy. There is no wheezing. He denies abdominal pain nausea vomiting or diarrhea. He  denies claudication. He denies edema. He denies  Myalgias.  Other system review is negative.  PE BP 138/88  Pulse 57  Ht 5\' 11"  (1.803 m)  Wt 178 lb 11.2 oz (81.058 kg)  BMI 24.93 kg/m2  General: Alert, oriented, no distress.  Skin: normal turgor, no rashes HEENT: Normocephalic, atraumatic. Pupils round and reactive; sclera anicteric;no lid lag.  Nose without nasal septal hypertrophy Mouth/Parynx benign; Mallinpatti scale Neck: No JVD, no carotid briuts Lungs: clear to ausculatation and percussion; no wheezing or rales Heart: RRR, s1 s2 normal  Abdomen: soft, nontender; no hepatosplenomehaly, BS+; abdominal aorta nontender and not dilated by palpation. Pulses 2+ Right groin catheterization site stable. Extremities: no clubbing cyanosis or edema, Homan's sign negative  Neurologic: grossly nonfocal  ECG: Sinus rhythm 57 beats per minute. No ST changes. Early transition in lead V2.  LABS:  BMET    Component Value Date/Time   NA 138 08/21/2013 0814   K 4.2 08/21/2013 0814   CL 102 08/21/2013 0814   CO2 29 08/21/2013 0814   GLUCOSE 100* 08/21/2013 0814   BUN 13 08/21/2013 0814   CREATININE 0.98 08/21/2013 0814   CREATININE 1.1 08/10/2008 1836   CALCIUM 9.5 08/21/2013 0814     Hepatic Function Panel     Component Value Date/Time   PROT 7.2 08/21/2013 0814   ALBUMIN 4.4 08/21/2013 0814   AST 26 08/21/2013 0814   ALT 24 08/21/2013 0814   ALKPHOS 66 08/21/2013 0814   BILITOT 0.7 08/21/2013 0814     CBC    Component Value Date/Time   WBC 3.7* 06/23/2013 1014   RBC 4.37 06/23/2013 1014   HGB 13.8 06/23/2013 1014   HCT 40.1 06/23/2013 1014   PLT 248 06/23/2013 1014   MCV 91.8 06/23/2013 1014   MCH 31.6 06/23/2013 1014   MCHC 34.4 06/23/2013 1014   RDW 12.6 06/23/2013 1014   LYMPHSABS 1.4 08/10/2008 1825   MONOABS 0.7 08/10/2008 1825   EOSABS 0.1 08/10/2008 1825   BASOSABS 0.0 08/10/2008 1825     BNP No results found for this basename: probnp    Lipid Panel     Component Value  Date/Time   CHOL 217* 06/23/2013 1014   TRIG 66 08/21/2013 0814   TRIG 68 06/23/2013 1014   HDL 52 06/23/2013 1014   CHOLHDL 4.2 06/23/2013 1014   VLDL 14 06/23/2013 1014   LDLCALC 66 08/21/2013 0814   LDLCALC 151* 06/23/2013 1014     RADIOLOGY: No results found.    ASSESSMENT AND PLAN: Jeffery Hutchinson has documented moderate coronary disease by his most recent cardiac catheterization. Previously, he has experienced 2 episodes of chest pain both from with moderate to more intense exertion. He  has not had any recurrent chest pain since initiating medical therapy. Hopefully with aggressive lipid intervention we will allow for plaque stability and hopeful progression. In mid-September he will undergo repeat laboratory consisting of compacted metabolic panel, NMR lipoprofile and adjustments will be made accordingly. I will see him in October for followup Cardiologic evaluation     Lennette Bihari, MD, [facility]  08/23/2013 9:36 PM

## 2013-08-24 ENCOUNTER — Telehealth: Payer: Self-pay | Admitting: *Deleted

## 2013-08-24 NOTE — Telephone Encounter (Signed)
Informed patient labs are normal

## 2013-08-24 NOTE — Telephone Encounter (Signed)
Message copied by Gaynelle Cage on Thu Aug 24, 2013  8:38 AM ------      Message from: Nicki Guadalajara A      Created: Wed Aug 23, 2013 10:40 PM       Labs ok ------

## 2013-09-26 ENCOUNTER — Encounter: Payer: Self-pay | Admitting: Cardiovascular Disease

## 2013-09-26 ENCOUNTER — Ambulatory Visit (INDEPENDENT_AMBULATORY_CARE_PROVIDER_SITE_OTHER): Payer: Medicare Other | Admitting: Cardiovascular Disease

## 2013-09-26 VITALS — BP 122/80 | HR 59 | Ht 71.0 in | Wt 177.5 lb

## 2013-09-26 DIAGNOSIS — I2510 Atherosclerotic heart disease of native coronary artery without angina pectoris: Secondary | ICD-10-CM

## 2013-09-26 DIAGNOSIS — R079 Chest pain, unspecified: Secondary | ICD-10-CM

## 2013-09-26 DIAGNOSIS — E785 Hyperlipidemia, unspecified: Secondary | ICD-10-CM

## 2013-09-26 NOTE — Patient Instructions (Signed)
Your physician recommends that you schedule a follow-up appointment in: 6 monhts

## 2013-09-26 NOTE — Progress Notes (Signed)
Patient ID: Jeffery Hutchinson, male   DOB: 1941/10/04, 72 y.o.   MRN: [id number]     HPI: Jeffery Hutchinson, is a 72 y.o. male who presents to the office today for followup cardiology evaluation.  Jeffery Hutchinson is a very pleasant 72 year old gentleman with recently developed episodes of exertionally precipitated substernal chest tightness. The first episode occurred will he was on the elliptical exercise machine and the second at occurred [place]. I saw him for initial cardiology evaluation on 06/13/2013. I was concerned that his symptoms were representative of angina. I initiated medical therapy with Lopressor 25 mg twice a day as as well as isosorbide mononitrate in addition to aspirin. Because of his fairly classic exertional symptomatology I did recommend definitive cardiac catheterization.  Catheterization was performed on 06/28/2013. This revealed normal LV function. He did have moderate coronary obstructive disease with 30-50% smooth narrowing in the LAD, 60-70% narrowing in the proximal circumflex with a 40-50% stenosis in the circumflex marginal vessel and the right coronary artery had 20% proximal and distal stenoses. My suspicion was that his culprit vessel was the circumflex vessel and he had not had any further chest pain since initiating medication. I initiated lipid-lowering therapy. He presents now for evaluation. Since he has been on medical therapy, he denies any recurrent chest pain episodes.   Jeffery Hutchinson recently underwent followup laboratory with an NMR lipoprotein a which now showed markedly improved lipid status with a total cholesterol being improved from 217-134, and LDL cholesterol at 151-66. HDL cholesterol was 55 triglyceride 66. LDL particle number was 1052. Some resistance was 37. VLDL size is still slightly increased at 48.5 as was small LDL particle number slightly increased at 550. As tolerated atorvastatin without side effects in addition to his after  negative fatty acids. He has not had recurrent anginal symptomatology on low-dose isosorbide 30 mg daily and metoprolol tartrate 25 twice a day. He continues to be active. He does exercise. He has not had further symptoms.   Past Medical History  Diagnosis Date  . Prostate cancer 2009    Past Surgical History  Procedure Laterality Date  . Tonsillectomy  1961    No Known Allergies  Current Outpatient Prescriptions  Medication Sig Dispense Refill  . aspirin 81 MG tablet Take 81 mg by mouth daily.      Marland Kitchen atorvastatin (LIPITOR) 40 MG tablet Take 1 tablet (40 mg total) by mouth daily at 6 PM.  30 tablet  5  . calcium-vitamin D (OSCAL WITH D) 500-200 MG-UNIT per tablet Take 1 tablet by mouth 2 (two) times daily.      . fish oil-omega-3 fatty acids 1000 MG capsule Take 2 g by mouth 2 (two) times daily.      Marland Kitchen ibuprofen (ADVIL,MOTRIN) 200 MG tablet Take 400 mg by mouth every 6 (six) hours as needed. For pain      . isosorbide mononitrate (IMDUR) 30 MG 24 hr tablet Take 1 tablet (30 mg total) by mouth daily.  30 tablet  6  . lactase (LACTAID) 3000 UNITS tablet Take 1 tablet by mouth 3 (three) times daily as needed. With milk products      . metoprolol tartrate (LOPRESSOR) 25 MG tablet Take 1 tablet (25 mg total) by mouth 2 (two) times daily.  60 tablet  6  . Multiple Vitamin (MULITIVITAMIN WITH MINERALS) TABS Take 1 tablet by mouth daily.      . sodium chloride (OCEAN) 0.65 % nasal  spray Place 2 sprays into the nose as needed. For dry nose       No current facility-administered medications for this visit.    History   Social History  . Marital Status: Married    Spouse Name: N/A    Number of Children: N/A  . Years of Education: N/A   Occupational History  . Not on file.   Social History Main Topics  . Smoking status: Former Games developer  . Smokeless tobacco: Former Neurosurgeon    Quit date: 11/30/1981  . Alcohol Use: 6 - 7 oz/week    12-14 drink(s) per week  . Drug Use: Not on file  .  Sexual Activity: Not on file   Other Topics Concern  . Not on file   Social History Narrative  . No narrative on file    Family History  Problem Relation Age of Onset  . Heart disease Mother 60  . Dementia Maternal Grandmother   . Hypertension Sister   . Heart disease Brother   . Thyroid disease Brother   . Heart failure Maternal Grandfather   . Heart disease Maternal Grandfather     ROS is negative for fevers, chills or night sweats.  He denies visual symptoms. He denies cough congestion or sputum production. He denies palpitations. He denies presyncope or syncope. There is no chest pain since she's been started with medical therapy. There is no wheezing. He denies abdominal pain nausea vomiting or diarrhea. He denies hematuria or hematochezia. He denies claudication. He denies edema. He denies  myalgias.  There is no history of diabetes mellitus or endocrine problems. Other comprehensive 12 point system review  is negative.  PE BP 122/80  Pulse 59  Ht 5\' 11"  (1.803 m)  Wt 177 lb 8 oz (80.513 kg)  BMI 24.77 kg/m2  General: Alert, oriented, no distress.  Skin: normal turgor, no rashes HEENT: Normocephalic, atraumatic. Pupils round and reactive; sclera anicteric;no lid lag.  Nose without nasal septal hypertrophy Mouth/Parynx benign; Mallinpatti scale 2 Neck: No JVD, no carotid briuts Lungs: clear to ausculatation and percussion; no wheezing or rales Heart: RRR, s1 s2 normal; Faint 1/ 6 systolic murmur  Abdomen: soft, nontender; no hepatosplenomehaly, BS+; abdominal aorta nontender and not dilated by palpation. Pulses 2+ Right groin catheterization site stable. Extremities: no clubbing cyanosis or edema, Homan's sign negative  Neurologic: grossly nonfocal Psychologic: Normal affect and mood.  ECG: Sinus rhythm 59 beats per minute. No ST changes. Early transition in lead V2.  LABS:  BMET    Component Value Date/Time   NA 138 08/21/2013 0814   K 4.2 08/21/2013 0814   CL  102 08/21/2013 0814   CO2 29 08/21/2013 0814   GLUCOSE 100* 08/21/2013 0814   BUN 13 08/21/2013 0814   CREATININE 0.98 08/21/2013 0814   CREATININE 1.1 08/10/2008 1836   CALCIUM 9.5 08/21/2013 0814     Hepatic Function Panel     Component Value Date/Time   PROT 7.2 08/21/2013 0814   ALBUMIN 4.4 08/21/2013 0814   AST 26 08/21/2013 0814   ALT 24 08/21/2013 0814   ALKPHOS 66 08/21/2013 0814   BILITOT 0.7 08/21/2013 0814     CBC    Component Value Date/Time   WBC 3.7* 06/23/2013 1014   RBC 4.37 06/23/2013 1014   HGB 13.8 06/23/2013 1014   HCT 40.1 06/23/2013 1014   PLT 248 06/23/2013 1014   MCV 91.8 06/23/2013 1014   MCH 31.6 06/23/2013 1014   MCHC 34.4 06/23/2013  1014   RDW 12.6 06/23/2013 1014   LYMPHSABS 1.4 08/10/2008 1825   MONOABS 0.7 08/10/2008 1825   EOSABS 0.1 08/10/2008 1825   BASOSABS 0.0 08/10/2008 1825     BNP No results found for this basename: probnp    Lipid Panel     Component Value Date/Time   CHOL 217* 06/23/2013 1014   TRIG 66 08/21/2013 0814   TRIG 68 06/23/2013 1014   HDL 52 06/23/2013 1014   CHOLHDL 4.2 06/23/2013 1014   VLDL 14 06/23/2013 1014   LDLCALC 66 08/21/2013 0814   LDLCALC 151* 06/23/2013 1014     RADIOLOGY: No results found.    ASSESSMENT AND PLAN: Jeffery Hutchinson has documented moderate coronary disease by his most recent cardiac catheterization. Previously, he has experienced 2 episodes of chest pain both from with moderate to more intense exertion. He has not had any recurrent chest pain since initiating medical therapy. He is tolerating his medical regimen. His most recent NMR lipoprotein of shows marked improvement in his lipid status with an LDL cholesterol now at 66 and LDL particle #1052 on atorvastatin 40 mg in addition fish oil.  Total cholesterol has improved from 217 to 134. Hopefully with his LDL in the 60s this will induce plaque regression. I encouraged him to continue with diet and exercise. I will see him in 6 months for followup evaluation or  sooner if problems arise.   Lennette Bihari, MD, [facility]  09/26/2013 2:51 PM

## 2013-09-27 ENCOUNTER — Encounter: Payer: Self-pay | Admitting: Cardiovascular Disease

## 2013-10-05 ENCOUNTER — Other Ambulatory Visit: Payer: Self-pay

## 2013-12-25 ENCOUNTER — Other Ambulatory Visit: Payer: Self-pay

## 2013-12-25 MED ORDER — ATORVASTATIN CALCIUM 40 MG PO TABS: 40.0000 mg | ORAL_TABLET | Freq: Every day | ORAL | Status: DC

## 2013-12-25 NOTE — Telephone Encounter (Signed)
Rx was sent to pharmacy electronically. 

## 2014-01-15 ENCOUNTER — Other Ambulatory Visit: Payer: Self-pay | Admitting: *Deleted

## 2014-01-15 MED ORDER — METOPROLOL TARTRATE 25 MG PO TABS: 25.0000 mg | ORAL_TABLET | Freq: Two times a day (BID) | ORAL | Status: DC

## 2014-01-15 MED ORDER — ISOSORBIDE MONONITRATE ER 30 MG PO TB24: 30.0000 mg | ORAL_TABLET | Freq: Every day | ORAL | Status: DC

## 2014-03-22 ENCOUNTER — Ambulatory Visit (INDEPENDENT_AMBULATORY_CARE_PROVIDER_SITE_OTHER): Payer: Medicare Other | Admitting: Cardiovascular Disease

## 2014-03-22 ENCOUNTER — Encounter: Payer: Self-pay | Admitting: Cardiovascular Disease

## 2014-03-22 VITALS — BP 140/70 | HR 61 | Ht 71.0 in | Wt 178.0 lb

## 2014-03-22 DIAGNOSIS — R0989 Other specified symptoms and signs involving the circulatory and respiratory systems: Secondary | ICD-10-CM

## 2014-03-22 DIAGNOSIS — R079 Chest pain, unspecified: Secondary | ICD-10-CM

## 2014-03-22 DIAGNOSIS — E785 Hyperlipidemia, unspecified: Secondary | ICD-10-CM

## 2014-03-22 DIAGNOSIS — I2510 Atherosclerotic heart disease of native coronary artery without angina pectoris: Secondary | ICD-10-CM

## 2014-03-22 NOTE — Patient Instructions (Signed)
  We will see you back in follow up in 6 months with Dr Claiborne Billings  Dr Claiborne Billings has ordered : Carotid Duplex- This test is an ultrasound of the carotid arteries in your neck. It looks at blood flow through these arteries that supply the brain with blood. Allow one hour for this exam. There are no restrictions or special instructions.

## 2014-03-24 ENCOUNTER — Encounter: Payer: Self-pay | Admitting: Cardiovascular Disease

## 2014-03-24 DIAGNOSIS — R0989 Other specified symptoms and signs involving the circulatory and respiratory systems: Secondary | ICD-10-CM | POA: Insufficient documentation

## 2014-03-24 NOTE — Progress Notes (Signed)
Patient ID: Jeffery Hutchinson, male   DOB: 12-02-1940, 73 y.o.   MRN: [id number]     HPI: Jeffery Hutchinson is a 72 y.o. male who presents to the office today for 6 month followup cardiology evaluation.  Jeffery Hutchinson is a 73 year old gentleman developed 2 episodes of exertionally precipitated substernal chest tightness last year. The first episode occurred when he was on the elliptical exercise machine and the second at occurred [place]. I saw him for initial cardiology evaluation on 06/13/2013. I was concerned that his symptoms were representative of angina. I initiated medical therapy with Lopressor 25 mg twice a day as as well as isosorbide mononitrate in addition to aspirin. Because of his fairly classic exertional symptomatology I did recommend definitive cardiac catheterization.  Catheterization was performed on 06/28/2013. This revealed normal LV function. He did have moderate coronary obstructive disease with 30-50% smooth narrowing in the LAD, 60-70% narrowing in the proximal circumflex with a 40-50% stenosis in the circumflex marginal vessel and the right coronary artery had 20% proximal and distal stenoses. My suspicion was that his culprit vessel was the circumflex vessel and he had not had any further chest pain since initiating medication. I initiated lipid-lowering therapy. He presents now for evaluation. Since he has been on medical therapy, he denies any recurrent chest pain episodes.   Jeffery Hutchinson underwent followup laboratory with an NMR lipoprotein a which now showed markedly improved lipid status with a total cholesterol being improved from 217-134, and LDL cholesterol at 151-66. HDL cholesterol was 55 triglyceride 66. LDL particle number was 1052. VLDL size is still slightly increased at 48.5 as was small LDL particle number slightly increased at 550. He has tolerated atorvastatin without side effects.   6 months, Jeffery Hutchinson has continued to do well.  He remains  active.  He denies recurrent chest pain.  He denies change in exercise tolerance.  He has been tolerating his medical therapy.  He has not required any sublingual nitroglycerin use.  Past Medical History  Diagnosis Date  . Prostate cancer 2009    Past Surgical History  Procedure Laterality Date  . Tonsillectomy  1961    No Known Allergies  Current Outpatient Prescriptions  Medication Sig Dispense Refill  . acetaminophen (TYLENOL) 325 MG tablet Take 650 mg by mouth as needed.      Marland Kitchen aspirin 81 MG tablet Take 81 mg by mouth daily.      Marland Kitchen atorvastatin (LIPITOR) 40 MG tablet Take 1 tablet (40 mg total) by mouth daily at 6 PM.  30 tablet  9  . calcium-vitamin D (OSCAL WITH D) 500-200 MG-UNIT per tablet Take 1 tablet by mouth 2 (two) times daily.      . fish oil-omega-3 fatty acids 1000 MG capsule Take 1 g by mouth 2 (two) times daily.       . isosorbide mononitrate (IMDUR) 30 MG 24 hr tablet Take 1 tablet (30 mg total) by mouth daily.  30 tablet  6  . metoprolol tartrate (LOPRESSOR) 25 MG tablet Take 1 tablet (25 mg total) by mouth 2 (two) times daily.  60 tablet  6  . Multiple Vitamin (MULITIVITAMIN WITH MINERALS) TABS Take 1 tablet by mouth daily.      . sodium chloride (OCEAN) 0.65 % nasal spray Place 2 sprays into the nose as needed. For dry nose       No current facility-administered medications for this visit.    History  Social History  . Marital Status: Married    Spouse Name: N/A    Number of Children: N/A  . Years of Education: N/A   Occupational History  . Not on file.   Social History Main Topics  . Smoking status: Former Research scientist (life sciences)  . Smokeless tobacco: Former Systems developer    Quit date: 11/30/1981  . Alcohol Use: 6.0 - 7.0 oz/week    12-14 drink(s) per week  . Drug Use: Not on file  . Sexual Activity: Not on file   Other Topics Concern  . Not on file   Social History Narrative  . No narrative on file    Family History  Problem Relation Age of Onset  . Heart  disease Mother 45  . Dementia Maternal Grandmother   . Hypertension Sister   . Heart disease Brother   . Thyroid disease Brother   . Heart failure Maternal Grandfather   . Heart disease Maternal Grandfather    ROS General: Negative; No fevers, chills, or night sweats HEENT: Negative; No changes in vision or hearing, sinus congestion, difficulty swallowing Pulmonary: Negative; No cough, wheezing, shortness of breath, hemoptysis Cardiovascular: Negative; No chest pain, presyncope, syncope, palpatations GI: Negative; No nausea, vomiting, diarrhea, or abdominal pain GU: Negative; No dysuria, hematuria, or difficulty voiding Musculoskeletal: Negative; no myalgias, joint pain, or weakness Hematologic: Negative; no easy bruising, bleeding Endocrine: Negative; no heat/cold intolerance Neuro: Negative; no changes in balance, headaches Skin: Negative; No rashes or skin lesions Psychiatric: Negative; No behavioral problems, depression Sleep: Negative; No daytime sleepiness, hypersomnolence, bruxism, restless legs, hypnogognic hallucinations, no cataplexy   PE BP 140/70  Pulse 61  Ht 5\' 11"  (1.803 m)  Wt 178 lb (80.74 kg)  BMI 24.84 kg/m2  General: Alert, oriented, no distress.  Skin: normal turgor, no rashes HEENT: Normocephalic, atraumatic. Pupils round and reactive; sclera anicteric;no lid lag.  Nose without nasal septal hypertrophy Mouth/Parynx benign; Mallinpatti scale 2 Neck: No JVD, soft right carotid bruit Chest wall: Nontender to palpation Lungs: clear to ausculatation and percussion; no wheezing or rales Heart: RRR, s1 s2 normal; Faint 1/ 6 systolic murmur; no diastolic murmur.  No S3 or S4 gallop.  No rubs, thrills or heaves. Abdomen: soft, nontender; no hepatosplenomehaly, BS+; abdominal aorta nontender and not dilated by palpation. Pulses 2+ Back: No CVA tenderness. Extremities: no clubbing cyanosis or edema, Homan's sign negative  Neurologic: grossly  nonfocal Psychologic: Normal affect and mood.  ECG (independently read by me):  Normal sinus rhythm at 61 beats per minute  Prior 09/26/2013 ECG: Sinus rhythm 59 beats per minute. No ST changes. Early transition in lead V2.  LABS:  BMET    Component Value Date/Time   NA 138 08/21/2013 0814   K 4.2 08/21/2013 0814   CL 102 08/21/2013 0814   CO2 29 08/21/2013 0814   GLUCOSE 100* 08/21/2013 0814   BUN 13 08/21/2013 0814   CREATININE 0.98 08/21/2013 0814   CREATININE 1.1 08/10/2008 1836   CALCIUM 9.5 08/21/2013 0814     Hepatic Function Panel     Component Value Date/Time   PROT 7.2 08/21/2013 0814   ALBUMIN 4.4 08/21/2013 0814   AST 26 08/21/2013 0814   ALT 24 08/21/2013 0814   ALKPHOS 66 08/21/2013 0814   BILITOT 0.7 08/21/2013 0814     CBC    Component Value Date/Time   WBC 3.7* 06/23/2013 1014   RBC 4.37 06/23/2013 1014   HGB 13.8 06/23/2013 1014   HCT 40.1 06/23/2013 1014  PLT 248 06/23/2013 1014   MCV 91.8 06/23/2013 1014   MCH 31.6 06/23/2013 1014   MCHC 34.4 06/23/2013 1014   RDW 12.6 06/23/2013 1014   LYMPHSABS 1.4 08/10/2008 1825   MONOABS 0.7 08/10/2008 1825   EOSABS 0.1 08/10/2008 1825   BASOSABS 0.0 08/10/2008 1825     BNP No results found for this basename: probnp    Lipid Panel     Component Value Date/Time   CHOL 217* 06/23/2013 1014   TRIG 66 08/21/2013 0814   TRIG 68 06/23/2013 1014   HDL 52 06/23/2013 1014   CHOLHDL 4.2 06/23/2013 1014   VLDL 14 06/23/2013 1014   LDLCALC 66 08/21/2013 0814   LDLCALC 151* 06/23/2013 1014     RADIOLOGY: No results found.    ASSESSMENT AND PLAN: Jeffery Hutchinson has documented moderate coronary disease by cardiac catheterization on 06/28/2013.Marland Kitchen Previously, he had experienced 2 episodes of chest pain both from with moderate to more intense exertion. He has not had any recurrent chest pain since initiating medical therapy.  His blood pressure today is well controlled.  He's not having any anginal symptoms.  Physical examination didn't  disclose a right carotid bruit.  I am recommending that we obtain a carotid Doppler study for further evaluation.  I will try to obtain any lab work that he may have recently had by Dr. Virgina Jock and review this.  He continues to be on atorvastatin 40 mg for hyperlipidemia.  He'll continue his current dose of isosorbide mononitrate 30 mg and metoprolol tartrate 25 mg twice a day for antianginal therapy.  As long as he remains stable, I will see him in 6 months for followup evaluation, but will contact him regarding his carotid Doppler results. Troy Sine, MD, [facility]  03/24/2014 4:05 PM

## 2014-03-26 ENCOUNTER — Ambulatory Visit (HOSPITAL_COMMUNITY)
Admission: RE | Admit: 2014-03-26 | Discharge: 2014-03-26 | Disposition: A | Discharge status: Home / Self Care | Payer: Medicare Other | Source: Ambulatory Visit | Attending: Cardiovascular Disease | Admitting: Cardiovascular Disease

## 2014-03-26 DIAGNOSIS — M7989 Other specified soft tissue disorders: Secondary | ICD-10-CM

## 2014-03-26 DIAGNOSIS — R0989 Other specified symptoms and signs involving the circulatory and respiratory systems: Secondary | ICD-10-CM | POA: Insufficient documentation

## 2014-03-26 NOTE — Progress Notes (Signed)
Carotid Duplex Completed. Sammye Staff, BS, RDMS, RVT  

## 2014-07-06 ENCOUNTER — Encounter: Payer: Self-pay | Admitting: Cardiovascular Disease

## 2014-07-18 ENCOUNTER — Encounter: Payer: Self-pay | Admitting: Cardiovascular Disease

## 2014-07-19 ENCOUNTER — Other Ambulatory Visit: Payer: Self-pay | Admitting: *Deleted

## 2014-07-19 MED ORDER — METOPROLOL TARTRATE 25 MG PO TABS: 25.0000 mg | ORAL_TABLET | Freq: Two times a day (BID) | ORAL | Status: DC

## 2014-07-19 MED ORDER — ISOSORBIDE MONONITRATE ER 30 MG PO TB24: 30.0000 mg | ORAL_TABLET | Freq: Every day | ORAL | Status: DC

## 2014-07-19 NOTE — Telephone Encounter (Signed)
[facility] sent in for imdur and metoprolol per patient request via mychart

## 2014-08-14 ENCOUNTER — Encounter: Payer: Self-pay | Admitting: Cardiovascular Disease

## 2014-10-02 ENCOUNTER — Ambulatory Visit (INDEPENDENT_AMBULATORY_CARE_PROVIDER_SITE_OTHER): Payer: Medicare Other | Admitting: Cardiovascular Disease

## 2014-10-02 ENCOUNTER — Encounter: Payer: Self-pay | Admitting: Cardiovascular Disease

## 2014-10-02 VITALS — BP 122/68 | HR 56 | Ht 71.0 in | Wt 177.2 lb

## 2014-10-02 DIAGNOSIS — E785 Hyperlipidemia, unspecified: Secondary | ICD-10-CM

## 2014-10-02 DIAGNOSIS — R0683 Snoring: Secondary | ICD-10-CM

## 2014-10-02 DIAGNOSIS — E782 Mixed hyperlipidemia: Secondary | ICD-10-CM

## 2014-10-02 DIAGNOSIS — Z79899 Other long term (current) drug therapy: Secondary | ICD-10-CM

## 2014-10-02 DIAGNOSIS — R0989 Other specified symptoms and signs involving the circulatory and respiratory systems: Secondary | ICD-10-CM

## 2014-10-02 DIAGNOSIS — I2583 Coronary atherosclerosis due to lipid rich plaque: Secondary | ICD-10-CM

## 2014-10-02 DIAGNOSIS — I2510 Atherosclerotic heart disease of native coronary artery without angina pectoris: Secondary | ICD-10-CM

## 2014-10-02 NOTE — Patient Instructions (Signed)
Your physician has recommended that you have a split night sleep study. This test records several body functions during sleep, including: brain activity, eye movement, oxygen and carbon dioxide blood levels, heart rate and rhythm, breathing rate and rhythm, the flow of air through your mouth and nose, snoring, body muscle movements, and chest and belly movement.  Your physician recommends that you return for lab work fasting.  Your physician recommends that you schedule a follow-up appointment in: 4 months.

## 2014-10-02 NOTE — Progress Notes (Signed)
Patient ID: Jeffery Hutchinson, male   DOB: 10/30/1941, 73 y.o.   MRN: [id number]     HPI: Jeffery Hutchinson is a 73 y.o. male who presents to the office today for 6 month followup cardiology evaluation.  [name] developed 2 episodes of exertionally precipitated substernal chest tightness last year. The first episode occurred when he was on the elliptical exercise machine and the second at occurred [place]. I saw him for initial cardiology evaluation on 06/13/2013. I was concerned that his symptoms were representative of angina. I initiated medical therapy with Lopressor 25 mg twice a day as as well as isosorbide mononitrate in addition to aspirin. Because of his fairly classic exertional symptomatology I  recommended definitive cardiac catheterization.  Catheterization on 06/28/2013 revealed normal LV function. He had moderate coronary obstructive disease with 30-50% smooth narrowing in the LAD, 60-70% narrowing in the proximal circumflex with a 40-50% stenosis in the circumflex marginal vessel and the right coronary artery had 20% proximal and distal stenoses. My suspicion was that his culprit vessel was the circumflex vessel.  Since he had not had any further symptoms since initiating medical therapy, medical therapy with aggressive lipid-lowering therapy was initially recommended.  [name] underwent followup laboratory with an NMR lipoprotein Whichshowed markedly improved lipid status with a total cholesterol being improved from 217 to 134, and LDL cholesterol at 151 to 66. HDL cholesterol was 55, triglyceride 66. LDL particle number was 1052. VLDL size is still slightly increased at 48.5 as was small LDL particle number slightly increased at 550. He has tolerated atorvastatin without side effects.   Over the past year,[name] has continued to do well.  He remains active.  He denies recurrent chest pain.  He denies change in exercise tolerance.  He has been tolerating his  medical therapy.  He has not required any sublingual nitroglycerin use.  When I last saw him, he did have a soft carotid bruit.  He underwent carotid duplex imaging on 03/26/2014.  This revealed mild plaque without evidence for significant diameter reduction, tortuosity or other vascular abnormalities.  Upon further questioning, he states that he is been told by his wife that he snores loudly and also has had witnessed episodes where he stops breathing while sleeping.  He typically goes to bed between 11 and 11:30 PM and wakes up between 7 and 7:30 AM.  He oftentimes gets up 1-3 times per night.  He presents for further evaluation.  Past Medical History  Diagnosis Date  . Prostate cancer 2009    Past Surgical History  Procedure Laterality Date  . Tonsillectomy  1961    No Known Allergies  Current Outpatient Prescriptions  Medication Sig Dispense Refill  . acetaminophen (TYLENOL) 325 MG tablet Take 650 mg by mouth as needed.    Marland Kitchen aspirin 81 MG tablet Take 81 mg by mouth daily.    Marland Kitchen atorvastatin (LIPITOR) 40 MG tablet Take 1 tablet (40 mg total) by mouth daily at 6 PM. 30 tablet 9  . calcium-vitamin D (OSCAL WITH D) 500-200 MG-UNIT per tablet Take 1 tablet by mouth 2 (two) times daily.    . fish oil-omega-3 fatty acids 1000 MG capsule Take 1 g by mouth 2 (two) times daily.     . isosorbide mononitrate (IMDUR) 30 MG 24 hr tablet Take 1 tablet (30 mg total) by mouth daily. 30 tablet 6  . metoprolol tartrate (LOPRESSOR) 25 MG tablet Take 1 tablet (25 mg  total) by mouth 2 (two) times daily. 60 tablet 6  . Multiple Vitamin (MULITIVITAMIN WITH MINERALS) TABS Take 1 tablet by mouth daily.    . sodium chloride (OCEAN) 0.65 % nasal spray Place 2 sprays into the nose as needed. For dry nose     No current facility-administered medications for this visit.    History   Social History  . Marital Status: Married    Spouse Name: N/A    Number of Children: N/A  . Years of Education: N/A    Occupational History  . Not on file.   Social History Main Topics  . Smoking status: Former Research scientist (life sciences)  . Smokeless tobacco: Former Systems developer    Quit date: 11/30/1981  . Alcohol Use: 7.2 - 8.4 oz/week    12-14 Not specified per week  . Drug Use: Not on file  . Sexual Activity: Not on file   Other Topics Concern  . Not on file   Social History Narrative    Family History  Problem Relation Age of Onset  . Heart disease Mother 62  . Dementia Maternal Grandmother   . Hypertension Sister   . Heart disease Brother   . Thyroid disease Brother   . Heart failure Maternal Grandfather   . Heart disease Maternal Grandfather    ROS General: Negative; No fevers, chills, or night sweats HEENT: Negative; No changes in vision or hearing, sinus congestion, difficulty swallowing Pulmonary: Negative; No cough, wheezing, shortness of breath, hemoptysis Cardiovascular: Negative; No chest pain, presyncope, syncope, palpatations GI: Negative; No nausea, vomiting, diarrhea, or abdominal pain GU: Negative; No dysuria, hematuria, or difficulty voiding Musculoskeletal: Negative; no myalgias, joint pain, or weakness Hematologic: Negative; no easy bruising, bleeding Endocrine: Negative; no heat/cold intolerance Neuro: Negative; no changes in balance, headaches Skin: Negative; No rashes or skin lesions Psychiatric: Negative; No behavioral problems, depression Sleep: positive for snoring and witnessed anea by his wife while sleeping; daytime sleepiness, hypersomnolence, bruxism, restless legs, hypnogognic hallucinations, no cataplexy   PE BP 122/68 mmHg  Pulse 56  Ht 5\' 11"  (1.803 m)  Wt 177 lb 3.2 oz (80.377 kg)  BMI 24.73 kg/m2  General: Alert, oriented, no distress.  Skin: normal turgor, no rashes HEENT: Normocephalic, atraumatic. Pupils round and reactive; sclera anicteric;no lid lag.  Nose without nasal septal hypertrophy Mouth/Parynx benign; Mallinpatti scale 2 Neck: No JVD, soft right  carotid bruit; normal carotid upstroke Chest wall: Nontender to palpation Lungs: clear to ausculatation and percussion; no wheezing or rales Heart: RRR, s1 s2 normal; Faint 1/ 6 systolic murmur; no diastolic murmur.  No S3 or S4 gallop.  No rubs, thrills or heaves. Abdomen: soft, nontender; no hepatosplenomehaly, BS+; abdominal aorta nontender and not dilated by palpation. Pulses 2+ Back: No CVA tenderness. Extremities: no clubbing cyanosis or edema, Homan's sign negative  Neurologic: grossly nonfocal Psychologic: Normal affect and mood.  ECG (independently read by me):    Prior April 2015 ECG (independently read by me):  Normal sinus rhythm at 61 beats per minute  Prior 09/26/2013 ECG: Sinus rhythm 59 beats per minute. No ST changes. Early transition in lead V2.  LABS:  BMET    Component Value Date/Time   NA 138 08/21/2013 0814   K 4.2 08/21/2013 0814   CL 102 08/21/2013 0814   CO2 29 08/21/2013 0814   GLUCOSE 100* 08/21/2013 0814   BUN 13 08/21/2013 0814   CREATININE 0.98 08/21/2013 0814   CREATININE 1.1 08/10/2008 1836   CALCIUM 9.5 08/21/2013 8756  Hepatic Function Panel     Component Value Date/Time   PROT 7.2 08/21/2013 0814   ALBUMIN 4.4 08/21/2013 0814   AST 26 08/21/2013 0814   ALT 24 08/21/2013 0814   ALKPHOS 66 08/21/2013 0814   BILITOT 0.7 08/21/2013 0814     CBC    Component Value Date/Time   WBC 3.7* 06/23/2013 1014   RBC 4.37 06/23/2013 1014   HGB 13.8 06/23/2013 1014   HCT 40.1 06/23/2013 1014   PLT 248 06/23/2013 1014   MCV 91.8 06/23/2013 1014   MCH 31.6 06/23/2013 1014   MCHC 34.4 06/23/2013 1014   RDW 12.6 06/23/2013 1014   LYMPHSABS 1.4 08/10/2008 1825   MONOABS 0.7 08/10/2008 1825   EOSABS 0.1 08/10/2008 1825   BASOSABS 0.0 08/10/2008 1825     BNP No results found for: PROBNP  Lipid Panel     Component Value Date/Time   CHOL 217* 06/23/2013 1014   TRIG 66 08/21/2013 0814   TRIG 68 06/23/2013 1014   HDL 52 06/23/2013  1014   CHOLHDL 4.2 06/23/2013 1014   VLDL 14 06/23/2013 1014   LDLCALC 66 08/21/2013 0814   LDLCALC 151* 06/23/2013 1014     RADIOLOGY: No results found.    ASSESSMENT AND PLAN: Mr. Cruise has documented moderate coronary disease by cardiac catheterization on 06/28/2013.Marland Kitchen Previously, he had experienced 2 episodes of chest pain both from with moderate to more intense exertion. He has not had any recurrent chest pain since initiating medical therapy.  He is on metoprolol 25 mg twice a day in addition to isosorbide mononitrate at 30 mg.  There have been no further anginal symptoms.  He is tolerating atorvastatin for hyperlipidemia.  He is on aspirin 81 mg for antiplatelet benefit.  He also has been taking fish oil.  I reviewed his carotid studies which were done to further evaluate his soft bruit.  This did not reveal hemodynamically significant plaque, although mild fibrous plaque was noted bilaterally.  His ECG remains stable.  With his history of snoring and witnessed apnea by his wife.  I am concerned about the possibility of obstructive sleep apnea.  For this reason, I will refer him for a diagnostic polysomnogram to evaluate for obstructive sleep apnea.  I will see him in the office in 4 months for reevaluation or sooner if problems progress.  Troy Sine, MD, [facility]  10/02/2014 12:57 PM

## 2014-10-03 ENCOUNTER — Ambulatory Visit (HOSPITAL_BASED_OUTPATIENT_CLINIC_OR_DEPARTMENT_OTHER): Payer: Medicare Other | Attending: Cardiovascular Disease | Admitting: Radiology

## 2014-10-03 VITALS — Ht 71.0 in | Wt 175.0 lb

## 2014-10-03 DIAGNOSIS — G4761 Periodic limb movement disorder: Secondary | ICD-10-CM | POA: Diagnosis not present

## 2014-10-03 DIAGNOSIS — Z8679 Personal history of other diseases of the circulatory system: Secondary | ICD-10-CM | POA: Insufficient documentation

## 2014-10-03 DIAGNOSIS — G4733 Obstructive sleep apnea (adult) (pediatric): Secondary | ICD-10-CM | POA: Insufficient documentation

## 2014-10-03 DIAGNOSIS — E785 Hyperlipidemia, unspecified: Secondary | ICD-10-CM | POA: Diagnosis not present

## 2014-10-03 DIAGNOSIS — I2510 Atherosclerotic heart disease of native coronary artery without angina pectoris: Secondary | ICD-10-CM

## 2014-10-03 DIAGNOSIS — R0683 Snoring: Secondary | ICD-10-CM

## 2014-10-03 DIAGNOSIS — G4730 Sleep apnea, unspecified: Secondary | ICD-10-CM | POA: Diagnosis present

## 2014-10-06 DIAGNOSIS — G4730 Sleep apnea, unspecified: Secondary | ICD-10-CM

## 2014-10-06 NOTE — Sleep Study (Signed)
NAME: Jeffery Hutchinson DATE OF BIRTH:  08-Feb-1941 MEDICAL RECORD NUMBER [id number]  LOCATION: [facility]  PHYSICIAN: Sheniah Supak A  DATE OF STUDY: 10/03/2014  SLEEP STUDY TYPE: Split Night Polysomnogram Report               REFERRING PHYSICIAN: Troy Sine, MD  INDICATION FOR STUDY: Snoring, non-restorative sleep, and witnessed apnea in this 73 year old patient with a history of coronary obstructive disease and hyperlipidemia  EPWORTH SLEEPINESS SCORE:  12 HEIGHT: 5\' 11"  (180.3 cm)  WEIGHT: 175 lb (79.379 kg)    Body mass index is 24.42 kg/(m^2).  NECK SIZE: 15 in.  MEDICATIONS:   sodium chloride (OCEAN) 0.65 % nasal spray 2 spray, As needed Multiple Vitamin (MULITIVITAMIN WITH MINERALS) TABS 1 tablet, Daily metoprolol tartrate (LOPRESSOR) 25 MG tablet 25 mg, 2 times daily isosorbide mononitrate (IMDUR) 30 MG 24 hr tablet 30 mg, Daily fish oil-omega-3 fatty acids 1000 MG capsule 1 g, 2 times daily calcium-vitamin D (OSCAL WITH D) 500-200 MG-UNIT per tablet 1 tablet, 2 times daily atorvastatin (LIPITOR) 40 MG tablet 40 mg, Daily-1800 aspirin 81 MG tablet 81 mg, Daily acetaminophen   SLEEP ARCHITECTURE:  The patient slept for 122 min out of 188 minutes on the diagnostic portion of the study (sleep efficiency 64.9%) with 7.5 minutes in Stage NI (6.1%), 91.5 minutes in N2 (75%), 0 minutes in N3, and 23 minutes in REM (18.9%). There was absent slow wave sleep. Latency to REM sleep was 109.5 minutes. During the titration: sleep efficiency improved to 89.9% (195/217 minutes). There was 2.1% of N1 sleep, 84.9%, N2 sleep, 0% of N3 sleep, and 13.1% of REM sleep.   RESPIRATORY DATA:  The apnea hypoxia index (AHI) during the baseline portion of the test was 28.0 per hour and during REM sleep was 23.5 per hour.  This places the patient into the moderate sleep apnea category.   The patient was observed to snore moderately.  During the titration portion of the test, the  patient was titrated initially at 5 cm water pressure up to 8 cm water pressure.  Overall, AHI was 2.2/hr during the titration period. The AHI was 0 at a CPAP pressure of 8 with a total of 6 arousals.  Although REM sleep was observed at 7 cm water pressure with a total AHI of 0.6 at 7 cm, REM sleep did not occur at 8 cm water pressure.  There was elimination of snoring  OXYGEN DATA: On the baseline portion of the study, the average oxygen saturation range during REM and non-REM sleep was 90.2%.  The lowest oxygen saturation during non-REM and REM sleep was 78% and 81%, respectively. With CPAP titration, the oxygen nadir was 89% with both REM and non-REM sleep.  CARDIAC DATA:  The patient maintained normal sinus rhythm, however, there were occasional blocked APCs.  The mean heart rate was 61.8.  MOVEMENT/PARASOMNIA: There were 67 periodic limb movements observed with an index of 33.0 with 8.4%, leading to arousal on the diagnostic portion of the study.  With titration, there were increased periodic leg movements with an index of 102.8 and 18.8, leading to arousal.  IMPRESSION/ RECOMMENDATION:  Moderate obstructive sleep apnea/hypoxia syndrome. Respiratory events with oxygen desaturation to 78% on the diagnostic study. Moderate snoring. Periodic limb movement disorder during sleep  CPAP with C-Flex/EPR of 3 at 8 cm water pressure with a heated humidifier is the initially recommended pressure. If patient is symptomatic with restless legs, consider a trial  of medical therapy. Recommend download in 30 days and sleep clinic follow-up evaluation   [place], American Board of Sleep Medicine  ELECTRONICALLY SIGNED ON:  10/06/2014, 1:14 PM [place] PH: [phone]   FX: [phone] [place]

## 2014-10-08 ENCOUNTER — Other Ambulatory Visit: Payer: Self-pay | Admitting: *Deleted

## 2014-10-08 ENCOUNTER — Telehealth: Payer: Self-pay | Admitting: Cardiovascular Disease

## 2014-10-08 DIAGNOSIS — I2510 Atherosclerotic heart disease of native coronary artery without angina pectoris: Secondary | ICD-10-CM

## 2014-10-08 DIAGNOSIS — E782 Mixed hyperlipidemia: Secondary | ICD-10-CM

## 2014-10-08 LAB — COMPREHENSIVE METABOLIC PANEL
ALK PHOS: 74 U/L (ref 39–117)
ALT: 20 U/L (ref 0–53)
AST: 22 U/L (ref 0–37)
Albumin: 3.9 g/dL (ref 3.5–5.2)
BILIRUBIN TOTAL: 0.5 mg/dL (ref 0.2–1.2)
BUN: 15 mg/dL (ref 6–23)
CO2: 30 mEq/L (ref 19–32)
Calcium: 9.1 mg/dL (ref 8.4–10.5)
Chloride: 105 mEq/L (ref 96–112)
Creat: 1.08 mg/dL (ref 0.50–1.35)
GLUCOSE: 100 mg/dL — AB (ref 70–99)
Potassium: 4.8 mEq/L (ref 3.5–5.3)
SODIUM: 140 meq/L (ref 135–145)
TOTAL PROTEIN: 6.4 g/dL (ref 6.0–8.3)

## 2014-10-08 LAB — CBC
HCT: 39 % (ref 39.0–52.0)
HEMOGLOBIN: 13.4 g/dL (ref 13.0–17.0)
MCH: 31 pg (ref 26.0–34.0)
MCHC: 34.4 g/dL (ref 30.0–36.0)
MCV: 90.3 fL (ref 78.0–100.0)
Platelets: 286 10*3/uL (ref 150–400)
RBC: 4.32 MIL/uL (ref 4.22–5.81)
RDW: 12.7 % (ref 11.5–15.5)
WBC: 4.3 10*3/uL (ref 4.0–10.5)

## 2014-10-08 LAB — TSH: TSH: 0.435 u[IU]/mL (ref 0.350–4.500)

## 2014-10-08 NOTE — Telephone Encounter (Signed)
Spoke to [place]  she asked  Rn if a lipid panel is needed with NMR LIPROFILE. IT was not ordered. RN reviewed patient's last office note and spoke to [facility].  lipids are also needed per Mariann Laster RN left message for [facility] panel ordered

## 2014-10-09 LAB — NMR LIPOPROFILE WITH LIPIDS
CHOLESTEROL, TOTAL: 123 mg/dL (ref 100–199)
HDL Particle Number: 34.3 umol/L (ref >=30.5)
HDL Size: 9.2 nm (ref >=9.2)
HDL-C: 52 mg/dL (ref >39)
LARGE VLDL-P: 2.9 nmol/L — AB (ref <=2.7)
LDL CALC: 57 mg/dL (ref 0–99)
LDL PARTICLE NUMBER: 806 nmol/L (ref <1000)
LDL Size: 20.1 nm (ref >=20.8)
LP-IR SCORE: 55 — AB (ref <=45)
Large HDL-P: 5.7 umol/L (ref >=4.8)
Small LDL Particle Number: 509 nmol/L (ref <=527)
TRIGLYCERIDES: 69 mg/dL (ref 0–149)
VLDL SIZE: 53.1 nm — AB (ref <=46.6)

## 2014-10-12 NOTE — Addendum Note (Signed)
Addended by: Shelva Majestic A on: 10/12/2014 11:37 AM   Modules accepted: Level of Service

## 2014-10-15 ENCOUNTER — Telehealth: Payer: Self-pay | Admitting: *Deleted

## 2014-10-15 NOTE — Telephone Encounter (Signed)
Called and informed patient of sleep study results and recommendations. Patient voiced understanding of the next step in the process.

## 2014-10-22 ENCOUNTER — Encounter: Payer: Self-pay | Admitting: Cardiovascular Disease

## 2014-10-22 MED ORDER — ATORVASTATIN CALCIUM 40 MG PO TABS: 40.0000 mg | ORAL_TABLET | Freq: Every day | ORAL | Status: DC

## 2014-11-08 ENCOUNTER — Encounter (HOSPITAL_COMMUNITY): Payer: Self-pay | Admitting: Cardiovascular Disease

## 2014-11-16 ENCOUNTER — Encounter: Payer: Self-pay | Admitting: Cardiovascular Disease

## 2015-01-21 ENCOUNTER — Encounter: Payer: Self-pay | Admitting: Cardiovascular Disease

## 2015-01-24 ENCOUNTER — Encounter: Payer: Self-pay | Admitting: Cardiovascular Disease

## 2015-01-24 ENCOUNTER — Ambulatory Visit: Payer: Medicare Other | Admitting: Cardiovascular Disease

## 2015-02-04 ENCOUNTER — Ambulatory Visit (INDEPENDENT_AMBULATORY_CARE_PROVIDER_SITE_OTHER): Payer: Medicare Other | Admitting: Cardiovascular Disease

## 2015-02-04 VITALS — BP 146/84 | HR 54 | Ht 69.0 in | Wt 178.6 lb

## 2015-02-04 DIAGNOSIS — R0989 Other specified symptoms and signs involving the circulatory and respiratory systems: Secondary | ICD-10-CM

## 2015-02-04 DIAGNOSIS — Z79899 Other long term (current) drug therapy: Secondary | ICD-10-CM | POA: Diagnosis not present

## 2015-02-04 DIAGNOSIS — E785 Hyperlipidemia, unspecified: Secondary | ICD-10-CM | POA: Diagnosis not present

## 2015-02-04 DIAGNOSIS — G4733 Obstructive sleep apnea (adult) (pediatric): Secondary | ICD-10-CM | POA: Diagnosis not present

## 2015-02-04 DIAGNOSIS — I2510 Atherosclerotic heart disease of native coronary artery without angina pectoris: Secondary | ICD-10-CM

## 2015-02-04 DIAGNOSIS — I10 Essential (primary) hypertension: Secondary | ICD-10-CM

## 2015-02-04 DIAGNOSIS — I2583 Coronary atherosclerosis due to lipid rich plaque: Principal | ICD-10-CM

## 2015-02-04 MED ORDER — LISINOPRIL 5 MG PO TABS: 5.0000 mg | ORAL_TABLET | Freq: Every day | ORAL | Status: DC

## 2015-02-04 NOTE — Patient Instructions (Addendum)
Your physician has recommended you make the following change in your medication: start new prescription for lisinopril 5 mg. This has already been sent to your [place].  Your physician recommends that you return for lab work in: 2 weeks. This does not need to be fasting.   Your physician recommends that you schedule a follow-up appointment in: 4 months with Dr. Claiborne Billings.

## 2015-02-05 ENCOUNTER — Encounter: Payer: Self-pay | Admitting: Cardiovascular Disease

## 2015-02-06 ENCOUNTER — Encounter: Payer: Self-pay | Admitting: Cardiovascular Disease

## 2015-02-06 DIAGNOSIS — I10 Essential (primary) hypertension: Secondary | ICD-10-CM | POA: Insufficient documentation

## 2015-02-06 DIAGNOSIS — G4733 Obstructive sleep apnea (adult) (pediatric): Secondary | ICD-10-CM | POA: Insufficient documentation

## 2015-02-06 NOTE — Progress Notes (Signed)
Patient ID: Jeffery Hutchinson, male   DOB: 04-07-1941, 74 y.o.   MRN: [id number]     HPI: Jeffery Hutchinson is a 74 y.o. male who presents to the office today for  followup cardiology evaluation.  [name] developed 2 episodes of exertionally precipitated substernal chest tightness in 2014. The first episode occurred when he was on the elliptical exercise machine and the second at [place]. I saw him for initial cardiology evaluation on 06/13/2013. I was concerned that his symptoms were representative of angina. I initiated medical therapy with Lopressor 25 mg twice a day as as well as isosorbide mononitrate in addition to aspirin. Because of his fairly classic exertional symptomatology I  recommended definitive cardiac catheterization.  Catheterization on 06/28/2013 revealed normal LV function. He had moderate coronary obstructive disease with 30-50% smooth narrowing in the LAD, 60-70% narrowing in the proximal circumflex with a 40-50% stenosis in the circumflex marginal vessel and the right coronary artery had 20% proximal and distal stenoses. My suspicion was that his culprit vessel was the circumflex vessel.  Since he had not had any further symptoms since initiating medical therapy, medical therapy with aggressive lipid-lowering therapy was initially recommended. Followup laboratory with an NMR lipoprotein showed markedly improved lipid status with a total cholesterol being improved from 217 to 134, and LDL cholesterol at 151 to 66. HDL cholesterol was 55, triglyceride 66. LDL particle number was 1052. VLDL size is still slightly increased at 48.5 as was small LDL particle number slightly increased at 550. He has tolerated atorvastatin without side effects.   Over the past year,[name] has continued to do well.  He remains active.  He denies recurrent chest pain.  He denies change in exercise tolerance.  He has been tolerating his medical therapy.  He has not required  any sublingual nitroglycerin use.  When I last saw him, he did have a soft carotid bruit.  He underwent carotid duplex imaging on 03/26/2014.  This revealed mild plaque without evidence for significant diameter reduction, tortuosity or other vascular abnormalities.  When I last saw him, I was concerned about obstructive sleep apnea.  He had a history of loud snoring and witnessed apnea according to his wife.   He typically goes to bed between 11 and 11:30 PM and wakes up between 7 and 7:30 AM.  He oftentimes gets up 1-3 times per night.   Since I saw him, he underwent a sleep study which confirmed my suspicion for sleep apnea.  This was notable for moderate obstructive sleep apnea.  Respiratory events were significant with a desaturation is 78%.  On the baseline portion of the split-night protocol.  His AHI was 28 per hour.  Overall and 23.5 per hour with REM sleep, placing him in a moderate sleep apnea category.  There was moderate snoring.  He underwent CPAP titration and a pressure of 8 cm was recommended.  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  He initiated CPAP therapy.  He states he then developed a cold for 3 weeks with bronchitis and consequently did not use the machine.  That he went away.  Over the Christmas break and did not use the machine.  A download received over 2 a half month.  Revealed very poor compliance due to his vacation and illness.  However, her AHI did improve to 2.5 with treatment.  He states that he then started to use the machine again with  a chinstrap and started to become used to Ms. machine, but he was told that since he was not compliant either had to pay for it himself or return it.  As result, he returned the machine.   Past Medical History  Diagnosis Date  . Prostate cancer 2009    Past Surgical History  Procedure Laterality Date  . Tonsillectomy  1961  . Left heart catheterization with coronary angiogram N/A 06/28/2013    Procedure:  LEFT HEART CATHETERIZATION WITH CORONARY ANGIOGRAM;  Surgeon: Troy Sine, MD;  Location: [facility] CATH LAB;  Service: Cardiovascular;  Laterality: N/A;    No Known Allergies  Current Outpatient Prescriptions  Medication Sig Dispense Refill  . acetaminophen (TYLENOL) 325 MG tablet Take 650 mg by mouth as needed.    Marland Kitchen aspirin 81 MG tablet Take 81 mg by mouth daily.    Marland Kitchen atorvastatin (LIPITOR) 40 MG tablet Take 1 tablet (40 mg total) by mouth daily at 6 PM. 30 tablet 9  . calcium-vitamin D (OSCAL WITH D) 500-200 MG-UNIT per tablet Take 1 tablet by mouth 2 (two) times daily.    . fish oil-omega-3 fatty acids 1000 MG capsule Take 1 g by mouth 2 (two) times daily.     . isosorbide mononitrate (IMDUR) 30 MG 24 hr tablet Take 1 tablet (30 mg total) by mouth daily. 30 tablet 6  . lisinopril (PRINIVIL,ZESTRIL) 5 MG tablet Take 1 tablet (5 mg total) by mouth daily. 30 tablet 6  . metoprolol tartrate (LOPRESSOR) 25 MG tablet Take 1 tablet (25 mg total) by mouth 2 (two) times daily. 60 tablet 6  . Multiple Vitamin (MULITIVITAMIN WITH MINERALS) TABS Take 1 tablet by mouth daily.    . sodium chloride (OCEAN) 0.65 % nasal spray Place 2 sprays into the nose as needed. For dry nose     No current facility-administered medications for this visit.    History   Social History  . Marital Status: Married    Spouse Name: N/A  . Number of Children: N/A  . Years of Education: N/A   Occupational History  . Not on file.   Social History Main Topics  . Smoking status: Former Research scientist (life sciences)  . Smokeless tobacco: Former Systems developer    Quit date: 11/30/1981  . Alcohol Use: 7.2 - 8.4 oz/week    12-14 Standard drinks or equivalent per week  . Drug Use: Not on file  . Sexual Activity: Not on file   Other Topics Concern  . Not on file   Social History Narrative    Family History  Problem Relation Age of Onset  . Heart disease Mother 39  . Dementia Maternal Grandmother   . Hypertension Sister   . Heart disease Brother    . Thyroid disease Brother   . Heart failure Maternal Grandfather   . Heart disease Maternal Grandfather    ROS General: Negative; No fevers, chills, or night sweats HEENT: Negative; No changes in vision or hearing, sinus congestion, difficulty swallowing Pulmonary: Negative; No cough, wheezing, shortness of breath, hemoptysis Cardiovascular: Negative; No chest pain, presyncope, syncope, palpatations GI: Negative; No nausea, vomiting, diarrhea, or abdominal pain GU: Negative; No dysuria, hematuria, or difficulty voiding Musculoskeletal: Negative; no myalgias, joint pain, or weakness Hematologic: Negative; no easy bruising, bleeding Endocrine: Negative; no heat/cold intolerance Neuro: Negative; no changes in balance, headaches Skin: Negative; No rashes or skin lesions Psychiatric: Negative; No behavioral problems, depression Sleep: positive for snoring and witnessed anea by his wife while sleeping; daytime sleepiness,  hypersomnolence, bruxism, restless legs, hypnogognic hallucinations, no cataplexy   PE BP 146/84 mmHg  Pulse 54  Ht 5\' 9"  (1.753 m)  Wt 178 lb 9.6 oz (81.012 kg)  BMI 26.36 kg/m2  General: Alert, oriented, no distress.  Skin: normal turgor, no rashes HEENT: Normocephalic, atraumatic. Pupils round and reactive; sclera anicteric;no lid lag.  Nose without nasal septal hypertrophy Mouth/Parynx benign; Mallinpatti scale 2 Neck: No JVD, soft right carotid bruit; normal carotid upstroke Chest wall: Nontender to palpation Lungs: clear to ausculatation and percussion; no wheezing or rales Heart: RRR, s1 s2 normal; Faint 1/ 6 systolic murmur; no diastolic murmur.  No S3 or S4 gallop.  No rubs, thrills or heaves. Abdomen: soft, nontender; no hepatosplenomehaly, BS+; abdominal aorta nontender and not dilated by palpation. Pulses 2+ Back: No CVA tenderness. Extremities: no clubbing cyanosis or edema, Homan's sign negative  Neurologic: grossly nonfocal Psychologic: Normal  affect and mood.  ECG (independently read by me):  Sinus bradycardia 54 bpm with mild sinus arrhythmia.  Normal intervals.  No ST segment changes.  Prior April 2015 ECG (independently read by me):  Normal sinus rhythm at 61 beats per minute  Prior 09/26/2013 ECG: Sinus rhythm 59 beats per minute. No ST changes. Early transition in lead V2.  LABS:  BMET    Component Value Date/Time   NA 140 10/08/2014 0935   K 4.8 10/08/2014 0935   CL 105 10/08/2014 0935   CO2 30 10/08/2014 0935   GLUCOSE 100* 10/08/2014 0935   BUN 15 10/08/2014 0935   CREATININE 1.08 10/08/2014 0935   CREATININE 1.1 08/10/2008 1836   CALCIUM 9.1 10/08/2014 0935     Hepatic Function Panel     Component Value Date/Time   PROT 6.4 10/08/2014 0935   ALBUMIN 3.9 10/08/2014 0935   AST 22 10/08/2014 0935   ALT 20 10/08/2014 0935   ALKPHOS 74 10/08/2014 0935   BILITOT 0.5 10/08/2014 0935     CBC    Component Value Date/Time   WBC 4.3 10/08/2014 0935   RBC 4.32 10/08/2014 0935   HGB 13.4 10/08/2014 0935   HCT 39.0 10/08/2014 0935   PLT 286 10/08/2014 0935   MCV 90.3 10/08/2014 0935   MCH 31.0 10/08/2014 0935   MCHC 34.4 10/08/2014 0935   RDW 12.7 10/08/2014 0935   LYMPHSABS 1.4 08/10/2008 1825   MONOABS 0.7 08/10/2008 1825   EOSABS 0.1 08/10/2008 1825   BASOSABS 0.0 08/10/2008 1825     BNP No results found for: PROBNP  Lipid Panel     Component Value Date/Time   CHOL 123 10/08/2014 0935   CHOL 217* 06/23/2013 1014   TRIG 69 10/08/2014 0935   TRIG 68 06/23/2013 1014   HDL 52 10/08/2014 0935   HDL 52 06/23/2013 1014   CHOLHDL 4.2 06/23/2013 1014   VLDL 14 06/23/2013 1014   LDLCALC 57 10/08/2014 0935   LDLCALC 151* 06/23/2013 1014     RADIOLOGY: No results found.    ASSESSMENT AND PLAN: Mr. Silveria has documented moderate coronary disease by cardiac catheterization on 06/28/2013.Marland Kitchen Previously, he had experienced 2 episodes of chest pain both from with moderate to more intense  exertion. He has not had any recurrent chest pain since initiating medical therapy consisting of metoprolol 25 mg twice a day in addition to isosorbide mononitrate at 30 mg.  There have been no further anginal symptoms.  He is tolerating atorvastatin for hyperlipidemia.  He is on aspirin 81 mg for antiplatelet benefit.  He also  has been taking fish oil. His carotid studies  did not reveal hemodynamically significant plaque, although mild fibrous plaque was noted bilaterally.  His ECG remains stable.  I spent a long time today and reviewed in detail.  His sleep study.  He does have moderate sleep apnea with significant oxygen desaturation is 78%.  I discussed with him the adverse consequent is of sleep apnea on his cardiovascular health.  She is now willing to consider reattempt at CPAP therapy.  He states that when he recently tried using the CPAP with a chinstrap he was able to tolerate it and did better.  I will need to contact his previous M[employer] company to see if he can still qualify to get his new machine back without having to undergo another sleep study.  Alternatively, he may be a candidate for a customized oral appliance.  His blood pressure is elevated today and on repeat by me was 152/84.  I have recommended the addition of lisinopril 5 mg.  Follow-up lab work will be obtained in 2 weeks.  I will see him in 3-4 months for reevaluation.  Time spent: 25 minutes  Troy Sine, MD, [facility]  02/06/2015 7:02 PM

## 2015-02-15 ENCOUNTER — Telehealth: Payer: Self-pay | Admitting: *Deleted

## 2015-02-15 DIAGNOSIS — E785 Hyperlipidemia, unspecified: Secondary | ICD-10-CM

## 2015-02-15 DIAGNOSIS — Z79899 Other long term (current) drug therapy: Secondary | ICD-10-CM

## 2015-02-15 MED ORDER — ATORVASTATIN CALCIUM 80 MG PO TABS: 80.0000 mg | ORAL_TABLET | Freq: Every day | ORAL | Status: DC

## 2015-02-15 NOTE — Telephone Encounter (Signed)
Notified patient of lab results and recommendations. Patient will try to increase the atorvastatin first. If he develops muscle pain he will call to inform us and then maybe try atorvastatin 40 mg with zetia. Labs will be redrawn in 6-8 weeks per protocol.

## 2015-02-15 NOTE — Telephone Encounter (Signed)
-----   Message from Troy Sine, MD sent at 02/10/2015 11:32 PM EDT ----- Labs from Dr. Virgina Jock; ideally LDL target is < 70; may need to consider inc atorva to 80 mg from 40 mg or add zetia, .

## 2015-02-19 LAB — BASIC METABOLIC PANEL
BUN: 15 mg/dL (ref 6–23)
CALCIUM: 9.5 mg/dL (ref 8.4–10.5)
CHLORIDE: 105 meq/L (ref 96–112)
CO2: 28 meq/L (ref 19–32)
CREATININE: 0.97 mg/dL (ref 0.50–1.35)
Glucose, Bld: 112 mg/dL — ABNORMAL HIGH (ref 70–99)
Potassium: 4.4 mEq/L (ref 3.5–5.3)
SODIUM: 141 meq/L (ref 135–145)

## 2015-02-22 ENCOUNTER — Telehealth: Payer: Self-pay | Admitting: Cardiovascular Disease

## 2015-02-22 NOTE — Telephone Encounter (Signed)
Pt wants to know what he can take over the counter for his allergies please.?

## 2015-02-22 NOTE — Telephone Encounter (Signed)
Spoke to patient Informed patient may use Claritin, Zyrtec,Allegra, Benadryl, or generic but nothing with a decongestant. Patient verbalized understanding.

## 2015-02-24 ENCOUNTER — Encounter: Payer: Self-pay | Admitting: Cardiovascular Disease

## 2015-03-11 ENCOUNTER — Other Ambulatory Visit: Payer: Self-pay | Admitting: *Deleted

## 2015-03-11 ENCOUNTER — Encounter: Payer: Self-pay | Admitting: Cardiovascular Disease

## 2015-03-11 MED ORDER — ISOSORBIDE MONONITRATE ER 30 MG PO TB24: 30.0000 mg | ORAL_TABLET | Freq: Every day | ORAL | Status: DC

## 2015-03-11 MED ORDER — METOPROLOL TARTRATE 25 MG PO TABS: 25.0000 mg | ORAL_TABLET | Freq: Two times a day (BID) | ORAL | Status: DC

## 2015-03-15 ENCOUNTER — Encounter: Payer: Self-pay | Admitting: Cardiovascular Disease

## 2015-04-06 LAB — COMPREHENSIVE METABOLIC PANEL
ALK PHOS: 75 U/L (ref 39–117)
ALT: 22 U/L (ref 0–53)
AST: 23 U/L (ref 0–37)
Albumin: 4 g/dL (ref 3.5–5.2)
BILIRUBIN TOTAL: 0.5 mg/dL (ref 0.2–1.2)
BUN: 14 mg/dL (ref 6–23)
CHLORIDE: 104 meq/L (ref 96–112)
CO2: 32 meq/L (ref 19–32)
Calcium: 9.3 mg/dL (ref 8.4–10.5)
Creat: 1 mg/dL (ref 0.50–1.35)
GLUCOSE: 97 mg/dL (ref 70–99)
POTASSIUM: 4.5 meq/L (ref 3.5–5.3)
SODIUM: 142 meq/L (ref 135–145)
TOTAL PROTEIN: 6.8 g/dL (ref 6.0–8.3)

## 2015-04-06 LAB — LIPID PANEL
CHOL/HDL RATIO: 2.5 ratio
CHOLESTEROL: 127 mg/dL (ref 0–200)
HDL: 50 mg/dL (ref >=40)
LDL Cholesterol: 66 mg/dL (ref 0–99)
Triglycerides: 56 mg/dL (ref <150)
VLDL: 11 mg/dL (ref 0–40)

## 2015-05-27 ENCOUNTER — Encounter: Payer: Self-pay | Admitting: Cardiovascular Disease

## 2015-05-27 ENCOUNTER — Other Ambulatory Visit: Payer: Self-pay

## 2015-06-10 ENCOUNTER — Encounter: Payer: Self-pay | Admitting: Cardiovascular Disease

## 2015-06-10 ENCOUNTER — Ambulatory Visit: Payer: Medicare Other | Admitting: Cardiovascular Disease

## 2015-06-10 ENCOUNTER — Ambulatory Visit (INDEPENDENT_AMBULATORY_CARE_PROVIDER_SITE_OTHER): Payer: Medicare Other | Admitting: Cardiovascular Disease

## 2015-06-10 VITALS — BP 102/70 | HR 48 | Ht 69.0 in | Wt 173.6 lb

## 2015-06-10 DIAGNOSIS — I2583 Coronary atherosclerosis due to lipid rich plaque: Secondary | ICD-10-CM

## 2015-06-10 DIAGNOSIS — I2510 Atherosclerotic heart disease of native coronary artery without angina pectoris: Secondary | ICD-10-CM | POA: Diagnosis not present

## 2015-06-10 DIAGNOSIS — E785 Hyperlipidemia, unspecified: Secondary | ICD-10-CM

## 2015-06-10 DIAGNOSIS — R0989 Other specified symptoms and signs involving the circulatory and respiratory systems: Secondary | ICD-10-CM

## 2015-06-10 DIAGNOSIS — I10 Essential (primary) hypertension: Secondary | ICD-10-CM | POA: Diagnosis not present

## 2015-06-10 NOTE — Patient Instructions (Signed)
Your physician wants you to follow-up in: 1 year or sooner if needed. You will receive a reminder letter in the mail two months in advance. If you don't receive a letter, please call our office to schedule the follow-up appointment.  

## 2015-06-10 NOTE — Progress Notes (Signed)
Patient ID: Jeffery Hutchinson, male   DOB: 1940/12/18, 74 y.o.   MRN: [id number]     HPI: Jeffery Hutchinson is a 74 y.o. male who presents to the office today for followup cardiology evaluation.  Jeffery Hutchinson developed 2 episodes of exertionally precipitated substernal chest tightness in 2014. The first episode occurred when he was on the elliptical exercise machine and the second at occurred [place]. When I saw him for initial cardiology evaluation on 06/13/2013 iwas concerned that his symptoms were representative of angina. I initiated medical therapy with Lopressor 25 mg twice a day as as well as isosorbide mononitrate in addition to aspirin. Because of his fairly classic exertional symptomatology I  recommended definitive cardiac catheterization.  Catheterization on 06/28/2013 revealed normal LV function. He had moderate coronary obstructive disease with 30-50% smooth narrowing in the LAD, 60-70% narrowing in the proximal circumflex with a 40-50% stenosis in the circumflex marginal vessel and the right coronary artery had 20% proximal and distal stenoses. My suspicion was that his culprit vessel was the circumflex vessel.  Since he had not had any further symptoms since initiating medical therapy, medical therapy with aggressive lipid-lowering therapy was initially recommended.  Followup laboratory with an NMR lipoprotein showed markedly improved lipid status with a total cholesterol being improved from 217 to 134, and LDL cholesterol at 151 to 66. HDL cholesterol was 55, triglyceride 66. LDL particle number was 1052. VLDL size is still slightly increased at 48.5 as was small LDL particle number slightly increased at 550. He has tolerated atorvastatin without side effects.   Over the past 2 years, Jeffery Hutchinson has continued to do well.  He remains active.  He denies recurrent chest pain.  He denies change in exercise tolerance.  He has been tolerating his medical therapy.  He has not  required any sublingual nitroglycerin use.  He has a soft carotid bruit.  He underwent carotid duplex imaging on 03/26/2014.  This revealed mild plaque without evidence for significant diameter reduction, tortuosity or other vascular abnormalities.  When I saw him last year, I was concerned about obstructive sleep apnea.  He had a history of loud snoring and witnessed apnea according to his wife.   He typically goes to bed between 11 and 11:30 PM and wakes up between 7 and 7:30 AM.  He oftentimes gets up 1-3 times per night.   A sleep study which confirmed my suspicion for sleep apnea.  This was notable for moderate obstructive sleep apnea.  Respiratory events were significant with a desaturation is 78%.  On the baseline portion of the split-night protocol.  His AHI was 28 per hour.  Overall and 23.5 per hour with REM sleep, placing him in a moderate sleep apnea category.  There was moderate snoring.  He underwent CPAP titration and a pressure of 8 cm was recommended.  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 He initiated CPAP therapy.   AHI did improve to 2.5 with treatment. He states he then developed a cold for 3 weeks with bronchitis and consequently did not use the machine.  Over the Christmas break and did not use the machine.  [facility], he apparently was not compliant during a 3 month post-initiation assessment was advised to return his machine.  Constantly, he has not been using CPAP for the last several months.  He is been having a sleep aid to assist with sleep and he feels  that his sleep is restorative.  He wakes up approximately 2 times per night to go to the bathroom.  He denies daytime sleepiness.  He is unaware of significant snoring.  He continues to exercise at least 3-4 days per week and almost daily typically doing elliptical exercise.  He denies chest pain.  He denies palpitations.  Denies presyncope or syncope.  He presents for follow up  evaluation.   Past Medical History  Diagnosis Date  . Prostate cancer 2009    Past Surgical History  Procedure Laterality Date  . Tonsillectomy  1961  . Left heart catheterization with coronary angiogram N/A 06/28/2013    Procedure: LEFT HEART CATHETERIZATION WITH CORONARY ANGIOGRAM;  Surgeon: Troy Sine, MD;  Location: [facility] CATH LAB;  Service: Cardiovascular;  Laterality: N/A;    No Known Allergies  Current Outpatient Prescriptions  Medication Sig Dispense Refill  . acetaminophen (TYLENOL) 325 MG tablet Take 650 mg by mouth as needed.    Marland Kitchen aspirin 81 MG tablet Take 81 mg by mouth daily.    Marland Kitchen atorvastatin (LIPITOR) 80 MG tablet Take 1 tablet (80 mg total) by mouth daily. 30 tablet 6  . calcium-vitamin D (OSCAL WITH D) 500-200 MG-UNIT per tablet Take 1 tablet by mouth 2 (two) times daily.    . fish oil-omega-3 fatty acids 1000 MG capsule Take 1 g by mouth 2 (two) times daily.     . isosorbide mononitrate (IMDUR) 30 MG 24 hr tablet Take 1 tablet (30 mg total) by mouth daily. 30 tablet 6  . lisinopril (PRINIVIL,ZESTRIL) 5 MG tablet Take 1 tablet (5 mg total) by mouth daily. 30 tablet 6  . metoprolol tartrate (LOPRESSOR) 25 MG tablet Take 1 tablet (25 mg total) by mouth 2 (two) times daily. 60 tablet 6  . Multiple Vitamin (MULITIVITAMIN WITH MINERALS) TABS Take 1 tablet by mouth daily.    . sodium chloride (OCEAN) 0.65 % nasal spray Place 2 sprays into the nose as needed. For dry nose     No current facility-administered medications for this visit.    History   Social History  . Marital Status: Married    Spouse Name: N/A  . Number of Children: N/A  . Years of Education: N/A   Occupational History  . Not on file.   Social History Main Topics  . Smoking status: Former Research scientist (life sciences)  . Smokeless tobacco: Former Systems developer    Quit date: 11/30/1981  . Alcohol Use: 7.2 - 8.4 oz/week    12-14 Standard drinks or equivalent per week  . Drug Use: Not on file  . Sexual Activity: Not on file     Other Topics Concern  . Not on file   Social History Narrative    Family History  Problem Relation Age of Onset  . Heart disease Mother 11  . Dementia Maternal Grandmother   . Hypertension Sister   . Heart disease Brother   . Thyroid disease Brother   . Heart failure Maternal Grandfather   . Heart disease Maternal Grandfather    ROS General: Negative; No fevers, chills, or night sweats HEENT: Negative; No changes in vision or hearing, sinus congestion, difficulty swallowing Pulmonary: Negative; No cough, wheezing, shortness of breath, hemoptysis Cardiovascular: Negative; No chest pain, presyncope, syncope, palpatations GI: Negative; No nausea, vomiting, diarrhea, or abdominal pain GU: Negative; No dysuria, hematuria, or difficulty voiding Musculoskeletal: Negative; no myalgias, joint pain, or weakness Hematologic: Negative; no easy bruising, bleeding Endocrine: Negative; no heat/cold intolerance Neuro: Negative; no  changes in balance, headaches Skin: Negative; No rashes or skin lesions Psychiatric: Negative; No behavioral problems, depression Sleep: positive for snoring and witnessed anea by his wife while sleeping and previous documentation of moderate obstructive sleep apnea; no daytime sleepiness, hypersomnolence, bruxism, restless legs, hypnogognic hallucinations, no cataplexy   PE BP 102/70 mmHg  Pulse 48  Ht $R'5\' 9"'AD$  (1.753 m)  Wt 173 lb 9.6 oz (78.744 kg)  BMI 25.62 kg/m2  General: Alert, oriented, no distress.  Skin: normal turgor, no rashes HEENT: Normocephalic, atraumatic. Pupils round and reactive; sclera anicteric;no lid lag.  Nose without nasal septal hypertrophy Mouth/Parynx benign; Mallinpatti scale 2 Neck: No JVD, soft right carotid bruit; normal carotid upstroke Chest wall: Nontender to palpation Lungs: clear to ausculatation and percussion; no wheezing or rales Heart: RRR, s1 s2 normal; Faint 1/ 6 systolic murmur; no diastolic murmur.  No S3 or S4  gallop.  No rubs, thrills or heaves. Abdomen: soft, nontender; no hepatosplenomehaly, BS+; abdominal aorta nontender and not dilated by palpation. Pulses 2+ Back: No CVA tenderness. Extremities: no clubbing cyanosis or edema, Homan's sign negative  Neurologic: grossly nonfocal Psychologic: Normal affect and mood.  ECG (independently read by me): Sinus bradycardia with sinus arrhythmia with a ventricular rate in the upper 40s to 50 range.  First-degree AV block with a PR interval at 238 ms.  No ST segment changes.  ECG (independently read by me):  Sinus bradycardia 54 bpm with mild sinus arrhythmia.  Normal intervals.  No ST segment changes.  Prior April 2015 ECG (independently read by me):  Normal sinus rhythm at 61 beats per minute  Prior 09/26/2013 ECG: Sinus rhythm 59 beats per minute. No ST changes. Early transition in lead V2.  LABS:  BMET    Component Value Date/Time   NA 142 04/05/2015 0811   K 4.5 04/05/2015 0811   CL 104 04/05/2015 0811   CO2 32 04/05/2015 0811   GLUCOSE 97 04/05/2015 0811   BUN 14 04/05/2015 0811   CREATININE 1.00 04/05/2015 0811   CREATININE 1.1 08/10/2008 1836   CALCIUM 9.3 04/05/2015 0811     Hepatic Function Panel     Component Value Date/Time   PROT 6.8 04/05/2015 0811   ALBUMIN 4.0 04/05/2015 0811   AST 23 04/05/2015 0811   ALT 22 04/05/2015 0811   ALKPHOS 75 04/05/2015 0811   BILITOT 0.5 04/05/2015 0811     CBC    Component Value Date/Time   WBC 4.3 10/08/2014 0935   RBC 4.32 10/08/2014 0935   HGB 13.4 10/08/2014 0935   HCT 39.0 10/08/2014 0935   PLT 286 10/08/2014 0935   MCV 90.3 10/08/2014 0935   MCH 31.0 10/08/2014 0935   MCHC 34.4 10/08/2014 0935   RDW 12.7 10/08/2014 0935   LYMPHSABS 1.4 08/10/2008 1825   MONOABS 0.7 08/10/2008 1825   EOSABS 0.1 08/10/2008 1825   BASOSABS 0.0 08/10/2008 1825     BNP No results found for: PROBNP  Lipid Panel     Component Value Date/Time   CHOL 127 04/05/2015 0811   CHOL 123  10/08/2014 0935   TRIG 56 04/05/2015 0811   TRIG 69 10/08/2014 0935   HDL 50 04/05/2015 0811   HDL 52 10/08/2014 0935   CHOLHDL 2.5 04/05/2015 0811   VLDL 11 04/05/2015 0811   LDLCALC 66 04/05/2015 0811   LDLCALC 57 10/08/2014 0935     RADIOLOGY: No results found.    ASSESSMENT AND PLAN: Mr. Vannice has documented moderate coronary disease  by cardiac catheterization on 06/28/2013.Marland Kitchen Previously, he had experienced 2 episodes of chest pain both from with moderate to more intense exertion. He has not had any recurrent chest pain since initiating medical therapy consisting of metoprolol 25 mg twice a day in addition to isosorbide mononitrate at 30 mg.  There have been no further anginal symptoms.  He is tolerating atorvastatin for hyperlipidemia.  He is on aspirin 81 mg for antiplatelet benefit.  He also has been taking fish oil. His carotid studies  did not reveal hemodynamically significant plaque, although mild fibrous plaque was noted bilaterally.  His ECG remains stable.  He is bradycardic with mild sinus arrhythmia and first-degree heart block.  He is on low-dose beta blocker, but has been sizing almost daily with aerobic exercise, which may also be contributing to his bradycardia.  I reviewed recent laboratory.  His lipids are excellent with a total cholesterol 127.  LDL cholesterol 66, HDL 50, VLDL 11, and triglycerides 56, on his current dose of atorvastatin 80 mg daily.  His blood pressure today is controlled and on repeat by me was 120/78 supine and 118/78 standing on his current regimen of lisinopril 5 mg, metoprolol 25 g twice a day, and isosorbide mononitrate 30 mg daily.  He does have previous documentation of moderate obstructive sleep apnea.  Unfortunately, he was advised by the [employer] company to return his machine since he had not met compliance standards.  Presently, he believes he is sleeping well.  He is unaware of recurrent bouts snoring.  He has been using a sleep aid for sleep  assistance and sleeps through the night soundly.  If he does develop recurrent symptomatology, I would recommend repeat evaluation or consideration of a customized oral appliance in light of his cardiovascular comorbidities.  He will be seeing Dr. Virgina Jock in February 2017.  As long as he remains stable, I will see him in one year for cardiology reevaluation.  Time spent: 25 minutes  Troy Sine, MD, [facility]  06/10/2015 12:13 PM

## 2015-08-29 ENCOUNTER — Encounter: Payer: Self-pay | Admitting: Cardiovascular Disease

## 2015-08-30 ENCOUNTER — Other Ambulatory Visit: Payer: Self-pay | Admitting: *Deleted

## 2015-08-30 MED ORDER — LISINOPRIL 5 MG PO TABS: 5.0000 mg | ORAL_TABLET | Freq: Every day | ORAL | Status: DC

## 2015-08-30 NOTE — Telephone Encounter (Signed)
Med refilled per patient request  

## 2015-09-12 ENCOUNTER — Encounter: Payer: Self-pay | Admitting: Cardiovascular Disease

## 2015-09-12 ENCOUNTER — Other Ambulatory Visit: Payer: Self-pay | Admitting: *Deleted

## 2015-09-12 MED ORDER — ATORVASTATIN CALCIUM 80 MG PO TABS: 80.0000 mg | ORAL_TABLET | Freq: Every day | ORAL | Status: DC

## 2015-10-09 ENCOUNTER — Encounter: Payer: Self-pay | Admitting: Cardiovascular Disease

## 2015-10-09 MED ORDER — METOPROLOL TARTRATE 25 MG PO TABS: 25.0000 mg | ORAL_TABLET | Freq: Two times a day (BID) | ORAL | Status: DC

## 2015-10-09 MED ORDER — ISOSORBIDE MONONITRATE ER 30 MG PO TB24: 30.0000 mg | ORAL_TABLET | Freq: Every day | ORAL | Status: DC

## 2016-02-03 ENCOUNTER — Encounter: Payer: Self-pay | Admitting: Cardiovascular Disease

## 2016-04-18 ENCOUNTER — Encounter: Payer: Self-pay | Admitting: Cardiovascular Disease

## 2016-04-20 ENCOUNTER — Other Ambulatory Visit: Payer: Self-pay | Admitting: *Deleted

## 2016-04-20 MED ORDER — ATORVASTATIN CALCIUM 80 MG PO TABS: 80.0000 mg | ORAL_TABLET | Freq: Every day | ORAL | Status: DC

## 2016-04-20 NOTE — Telephone Encounter (Signed)
Rx has been sent to the pharmacy electronically. ° °

## 2016-04-21 ENCOUNTER — Other Ambulatory Visit: Payer: Self-pay | Admitting: *Deleted

## 2016-04-21 NOTE — Telephone Encounter (Signed)
Rx(s) sent to pharmacy electronically.  

## 2016-07-06 ENCOUNTER — Encounter: Payer: Self-pay | Admitting: Cardiovascular Disease

## 2016-07-06 ENCOUNTER — Ambulatory Visit (INDEPENDENT_AMBULATORY_CARE_PROVIDER_SITE_OTHER): Payer: Medicare Other | Admitting: Cardiovascular Disease

## 2016-07-06 VITALS — BP 134/78 | HR 57 | Ht 69.0 in | Wt 178.0 lb

## 2016-07-06 DIAGNOSIS — I2510 Atherosclerotic heart disease of native coronary artery without angina pectoris: Secondary | ICD-10-CM | POA: Diagnosis not present

## 2016-07-06 DIAGNOSIS — I10 Essential (primary) hypertension: Secondary | ICD-10-CM | POA: Diagnosis not present

## 2016-07-06 DIAGNOSIS — E785 Hyperlipidemia, unspecified: Secondary | ICD-10-CM

## 2016-07-06 DIAGNOSIS — G4733 Obstructive sleep apnea (adult) (pediatric): Secondary | ICD-10-CM

## 2016-07-06 DIAGNOSIS — R0989 Other specified symptoms and signs involving the circulatory and respiratory systems: Secondary | ICD-10-CM | POA: Diagnosis not present

## 2016-07-06 DIAGNOSIS — I2583 Coronary atherosclerosis due to lipid rich plaque: Principal | ICD-10-CM

## 2016-07-06 NOTE — Progress Notes (Signed)
Patient ID: Jeffery Hutchinson, male   DOB: 1941-05-24, 75 y.o.   MRN: [id number]    Primary MD:  Dr. Virgina Jock  HPI: Jeffery Hutchinson is a 75 y.o. male who presents to the office today for a one year followup cardiology evaluation.  Jeffery Hutchinson developed 2 episodes of exertionally precipitated substernal chest tightness in 2014. The first episode occurred when he was on the elliptical exercise machine and the second at occurred [place]. When I saw him for initial cardiology evaluation on 06/13/2013 iwas concerned that his symptoms were representative of angina. I initiated medical therapy with Lopressor 25 mg twice a day as as well as isosorbide mononitrate in addition to aspirin. Because of his fairly classic exertional symptomatology I  recommended definitive cardiac catheterization.  Catheterization on 06/28/2013 revealed normal LV function. He had moderate coronary obstructive disease with 30-50% smooth narrowing in the LAD, 60-70% narrowing in the proximal circumflex with a 40-50% stenosis in the circumflex marginal vessel and the right coronary artery had 20% proximal and distal stenoses. My suspicion was that his culprit vessel was the circumflex vessel.  Since he had not had any further symptoms since initiating medical therapy, medical therapy with aggressive lipid-lowering therapy was initially recommended.  Followup laboratory with an NMR lipoprotein showed markedly improved lipid status with a total cholesterol being improved from 217 to 134, and LDL cholesterol at 151 to 66. HDL cholesterol was 55, triglyceride 66. LDL particle number was 1052. VLDL size is still slightly increased at 48.5 as was small LDL particle number slightly increased at 550. He has tolerated atorvastatin without side effects.   Over the past 2 years, Jeffery Hutchinson has continued to do well.  He remains active.  He denies recurrent chest pain.  He denies change in exercise tolerance.  He has been tolerating  his medical therapy.  He has not required any sublingual nitroglycerin use.  He has a soft carotid bruit.  He underwent carotid duplex imaging on 03/26/2014.  This revealed mild plaque without evidence for significant diameter reduction, tortuosity or other vascular abnormalities.  When I saw him last year, I was concerned about obstructive sleep apnea.  He had a history of loud snoring and witnessed apnea according to his wife.   He typically goes to bed between 11 and 11:30 PM and wakes up between 7 and 7:30 AM.  He oftentimes gets up 1-3 times per night.   A sleep study which confirmed my suspicion for sleep apnea.  This was notable for moderate obstructive sleep apnea.  Respiratory events were significant with a desaturation is 78%.  On the baseline portion of the split-night protocol.  His AHI was 28 per hour.  Overall and 23.5 per hour with REM sleep, placing him in a moderate sleep apnea category.  There was moderate snoring.  He underwent CPAP titration and a pressure of 8 cm was recommended.  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 He initiated CPAP therapy.   AHI improved to 2.5 with treatment. He then developed a cold for 3 weeks with bronchitis and consequently did not use the machine.  Subsequently, he was noncompliant during his 3 month post-initiation assessment and Jeffery Hutchinson advised that he return his machine.  He has not been using CPAP since that time.  Presently, he feels that he is sleeping well.  He believes his sleep is restorative.  He is not aware of any snoring.  He denies daytime sleepiness.  Over the past year, he has continued to remain active.  He is walking almost daily up to 3 miles per day for proximally 45-50 minutes.  He had complete set of blood work done by Dr. Virgina Jock in February was told that his labs were excellent.  Last week he saw Dr. Virgina Jock with complaints of some intermittent right upper quadrant discomfort.  Apparently he had an  abdominal ultrasound.  He's not yet aware of these results.  He denies any exertionally precipitated chest pain.  He denies palpitations,  presyncope or syncope.  He presents for follow up evaluation.   Past Medical History:  Diagnosis Date  . Prostate cancer ([place]) 2009    Past Surgical History:  Procedure Laterality Date  . LEFT HEART CATHETERIZATION WITH CORONARY ANGIOGRAM N/A 06/28/2013   Procedure: LEFT HEART CATHETERIZATION WITH CORONARY ANGIOGRAM;  Surgeon: Troy Sine, MD;  Location: [facility] CATH LAB;  Service: Cardiovascular;  Laterality: N/A;  . TONSILLECTOMY  1961    No Known Allergies  Current Outpatient Prescriptions  Medication Sig Dispense Refill  . acetaminophen (TYLENOL) 325 MG tablet Take 650 mg by mouth as needed.    Marland Kitchen aspirin 81 MG tablet Take 81 mg by mouth daily.    Marland Kitchen atorvastatin (LIPITOR) 80 MG tablet Take 1 tablet (80 mg total) by mouth daily. 30 tablet 2  . calcium-vitamin D (OSCAL WITH D) 500-200 MG-UNIT per tablet Take 1 tablet by mouth 2 (two) times daily.    . fish oil-omega-3 fatty acids 1000 MG capsule Take 1 g by mouth 2 (two) times daily.     . isosorbide mononitrate (IMDUR) 30 MG 24 hr tablet Take 1 tablet (30 mg total) by mouth daily. 30 tablet 9  . lisinopril (PRINIVIL,ZESTRIL) 5 MG tablet Take 1 tablet (5 mg total) by mouth daily. 30 tablet 11  . metoprolol tartrate (LOPRESSOR) 25 MG tablet Take 1 tablet (25 mg total) by mouth 2 (two) times daily. 60 tablet 9  . Multiple Vitamin (MULITIVITAMIN WITH MINERALS) TABS Take 1 tablet by mouth daily.    . sodium chloride (OCEAN) 0.65 % nasal spray Place 2 sprays into the nose as needed. For dry nose     No current facility-administered medications for this visit.     Social History   Social History  . Marital status: Married    Spouse name: N/A  . Number of children: N/A  . Years of education: N/A   Occupational History  . Not on file.   Social History Main Topics  . Smoking status: Former Research scientist (life sciences)   . Smokeless tobacco: Former Systems developer    Quit date: 11/30/1981  . Alcohol use 7.2 - 8.4 oz/week    12 - 14 Standard drinks or equivalent per week  . Drug use: Unknown  . Sexual activity: Not on file   Other Topics Concern  . Not on file   Social History Narrative  . No narrative on file    Family History  Problem Relation Age of Onset  . Heart disease Mother 85  . Dementia Maternal Grandmother   . Hypertension Sister   . Heart disease Brother   . Thyroid disease Brother   . Heart failure Maternal Grandfather   . Heart disease Maternal Grandfather    ROS General: Negative; No fevers, chills, or night sweats HEENT: Negative; No changes in vision or hearing, sinus congestion, difficulty swallowing Pulmonary: Negative; No cough, wheezing, shortness of breath, hemoptysis Cardiovascular: Negative; No chest  pain, presyncope, syncope, palpatations GI: Positive for recent intermittent right upper quadrant discomfort; No nausea, vomiting, diarrhea, or abdominal pain GU: Negative; No dysuria, hematuria, or difficulty voiding Musculoskeletal: Negative; no myalgias, joint pain, or weakness Hematologic: Negative; no easy bruising, bleeding Endocrine: Negative; no heat/cold intolerance Neuro: Negative; no changes in balance, headaches Skin: Negative; No rashes or skin lesions Psychiatric: Negative; No behavioral problems, depression Sleep: positive for snoring and witnessed anea by his wife while sleeping and previous documentation of moderate obstructive sleep apnea; no daytime sleepiness, hypersomnolence, bruxism, restless legs, hypnogognic hallucinations, no cataplexy   PE BP 134/78   Pulse (!) 57   Ht _0  (1.753 m)   Wt 178 lb (80.7 kg)   BMI 26.29 kg/m    Repeat blood pressure by me 120/70.  Wt Readings from Last 3 Encounters:  07/06/16 178 lb (80.7 kg)  06/10/15 173 lb 9.6 oz (78.7 kg)  02/04/15 178 lb 9.6 oz (81 kg)   General: Alert, oriented, no distress.  Skin: normal  turgor, no rashes HEENT: Normocephalic, atraumatic. Pupils round and reactive; sclera anicteric;no lid lag.  Nose without nasal septal hypertrophy Mouth/Parynx benign; Mallinpatti scale 2 Neck: No JVD, soft right carotid bruit; normal carotid upstroke Chest wall: Nontender to palpation Lungs: clear to ausculatation and percussion; no wheezing or rales Heart: RRR, s1 s2 normal; Faint 1/ 6 systolic murmur; no diastolic murmur.  No S3 or S4 gallop.  No rubs, thrills or heaves. Abdomen: soft, nontender; no hepatosplenomehaly, BS+; abdominal aorta nontender and not dilated by palpation. Pulses 2+ Back: No CVA tenderness. Extremities: no clubbing cyanosis or edema, Homan's sign negative  Neurologic: grossly nonfocal Psychologic: Normal affect and mood.  ECG (independently read by me): Sinus bradycardia with mild sinus arrhythmia at 57 bpm.  Borderline first-degree AV block with a PR interval of 212 ms.  QTc interval normal  July 2016 ECG (independently read by me): Sinus bradycardia with sinus arrhythmia with a ventricular rate in the upper 40s to 50 range.  First-degree AV block with a PR interval at 238 ms.  No ST segment changes.  ECG (independently read by me):  Sinus bradycardia 54 bpm with mild sinus arrhythmia.  Normal intervals.  No ST segment changes.  Prior April 2015 ECG (independently read by me):  Normal sinus rhythm at 61 beats per minute  Prior 09/26/2013 ECG: Sinus rhythm 59 beats per minute. No ST changes. Early transition in lead V2.  LABS:  BMP Latest Ref Rng & Units 04/05/2015 02/18/2015 10/08/2014  Glucose 70 - 99 mg/dL 97 112(H) 100(H)  BUN 6 - 23 mg/dL _1 Creatinine 0.50 - 1.35 mg/dL 1.00 0.97 1.08  Sodium 135 - 145 mEq/L 142 141 140  Potassium 3.5 - 5.3 mEq/L 4.5 4.4 4.8  Chloride 96 - 112 mEq/L 104 105 105  CO2 19 - 32 mEq/L 32 28 30  Calcium 8.4 - 10.5 mg/dL 9.3 9.5 9.1   Hepatic Function Latest Ref Rng & Units 04/05/2015 10/08/2014 08/21/2013  Total Protein  6.0 - 8.3 g/dL 6.8 6.4 7.2  Albumin 3.5 - 5.2 g/dL 4.0 3.9 4.4  AST 0 - 37 U/L _2 ALT 0 - 53 U/L _3 Alk Phosphatase 39 - 117 U/L 75 74 66  Total Bilirubin 0.2 - 1.2 mg/dL 0.5 0.5 0.7  \ CBC Latest Ref Rng & Units 10/08/2014 06/23/2013 08/10/2008  WBC 4.0 - 10.5 K/uL 4.3 3.7(L) -  Hemoglobin 13.0 - 17.0 g/dL 13.4  13.8 13.9  Hematocrit 39.0 - 52.0 % 39.0 40.1 41.0  Platelets 150 - 400 K/uL 286 248 -   Lab Results  Component Value Date   MCV 90.3 10/08/2014   MCV 91.8 06/23/2013   MCV 96.7 08/10/2008   Lab Results  Component Value Date   TSH 0.435 10/08/2014   No results found for: HGBA1C  Lipid Panel     Component Value Date/Time   CHOL 127 04/05/2015 0811   CHOL 123 10/08/2014 0935   TRIG 56 04/05/2015 0811   TRIG 69 10/08/2014 0935   HDL 50 04/05/2015 0811   HDL 52 10/08/2014 0935   CHOLHDL 2.5 04/05/2015 0811   VLDL 11 04/05/2015 0811   LDLCALC 66 04/05/2015 0811   LDLCALC 57 10/08/2014 0935     RADIOLOGY: No results found.    ASSESSMENT AND PLAN: Jeffery Hutchinson is a 32 -year-old Caucasian male who has documented moderate coronary disease by cardiac catheterization on 06/28/2013.Marland Kitchen Previously, he had experienced 2 episodes of chest pain both from with moderate to more intense exertion. He has not had any recurrent chest pain since initiating medical therapy consisting of metoprolol 25 mg twice a day and isosorbide mononitrate at 30 mg.  There have been no further anginal symptoms.  He is tolerating atorvastatin 80 mg for hyperlipidemia with hopeful plaque regression.  Target LDL is less than 70.  He had a complete set of blood work done earlier this year by Dr. Virgina Jock and I will try to obtain these results for my review.  He is on aspirin 81 mg for antiplatelet benefit.  He also has been taking fish oil.  He has a soft carotid bruit and prior carotid studies did not reveal hemodynamically significant plaque, although mild fibrous plaque was noted bilaterally.  His  ECG remains stable.  He is bradycardic with mild sinus arrhythmia and first-degree heart block which is unchanged from previously.  His blood pressure today is well controlled on his metoprolol in addition to lisinopril 5 mg daily and nitrate therapy.  He continues to walk daily and has been without symptoms.  On exam today he is nontender but last week he had an episode of right upper quadrant tenderness for which he underwent an ultrasound evaluation.  Not aware of these results.  This were not done in the [facility] system.  He has been diagnosed with sleep apnea in the past.  He remains asymptomatic, not on CPAP.  therapy.  As long as he remains stable, I will see him in one year for cardiology reevaluation.  Time spent: 25 minutes  Troy Sine, MD, [facility]  07/06/2016 9:18 AM

## 2016-07-06 NOTE — Patient Instructions (Signed)
Your physician wants you to follow-up in: 1 year or sooner if needed. You will receive a reminder letter in the mail two months in advance. If you don't receive a letter, please call our office to schedule the follow-up appointment.   If you need a refill on your cardiac medications before your next appointment, please call your pharmacy.   

## 2016-07-16 ENCOUNTER — Encounter: Payer: Self-pay | Admitting: Cardiovascular Disease

## 2016-07-16 ENCOUNTER — Other Ambulatory Visit: Payer: Self-pay

## 2016-07-16 MED ORDER — ATORVASTATIN CALCIUM 80 MG PO TABS: 80.0000 mg | ORAL_TABLET | Freq: Every day | ORAL | 6 refills | Status: DC

## 2016-07-30 ENCOUNTER — Other Ambulatory Visit: Payer: Self-pay | Admitting: *Deleted

## 2016-07-30 ENCOUNTER — Encounter: Payer: Self-pay | Admitting: Cardiovascular Disease

## 2016-07-30 MED ORDER — ISOSORBIDE MONONITRATE ER 30 MG PO TB24: 30.0000 mg | ORAL_TABLET | Freq: Every day | ORAL | 9 refills | Status: DC

## 2016-07-30 MED ORDER — METOPROLOL TARTRATE 25 MG PO TABS: 25.0000 mg | ORAL_TABLET | Freq: Two times a day (BID) | ORAL | 9 refills | Status: DC

## 2016-08-31 ENCOUNTER — Other Ambulatory Visit: Payer: Self-pay | Admitting: Cardiovascular Disease

## 2017-02-17 ENCOUNTER — Encounter: Payer: Self-pay | Admitting: Cardiovascular Disease

## 2017-02-18 ENCOUNTER — Other Ambulatory Visit: Payer: Self-pay

## 2017-02-18 MED ORDER — ATORVASTATIN CALCIUM 80 MG PO TABS: 80.0000 mg | ORAL_TABLET | Freq: Every day | ORAL | 6 refills | Status: DC

## 2017-02-18 NOTE — Telephone Encounter (Signed)
Per pt e-mail sent: I have no refills left for atorvastatin 80 mg. Please renew refills with [facility], [address] [place]. [phone].  Refill sent

## 2017-03-04 ENCOUNTER — Encounter: Payer: Self-pay | Admitting: Cardiovascular Disease

## 2017-04-19 ENCOUNTER — Encounter: Payer: Self-pay | Admitting: Cardiovascular Disease

## 2017-04-19 ENCOUNTER — Ambulatory Visit (INDEPENDENT_AMBULATORY_CARE_PROVIDER_SITE_OTHER): Payer: Medicare Other | Admitting: Cardiovascular Disease

## 2017-04-19 VITALS — BP 140/100 | HR 50 | Ht 69.0 in | Wt 176.0 lb

## 2017-04-19 DIAGNOSIS — G4733 Obstructive sleep apnea (adult) (pediatric): Secondary | ICD-10-CM

## 2017-04-19 DIAGNOSIS — R0989 Other specified symptoms and signs involving the circulatory and respiratory systems: Secondary | ICD-10-CM

## 2017-04-19 DIAGNOSIS — I2510 Atherosclerotic heart disease of native coronary artery without angina pectoris: Secondary | ICD-10-CM | POA: Diagnosis not present

## 2017-04-19 DIAGNOSIS — I2583 Coronary atherosclerosis due to lipid rich plaque: Secondary | ICD-10-CM | POA: Diagnosis not present

## 2017-04-19 DIAGNOSIS — I10 Essential (primary) hypertension: Secondary | ICD-10-CM

## 2017-04-19 MED ORDER — ATORVASTATIN CALCIUM 80 MG PO TABS: 80.0000 mg | ORAL_TABLET | Freq: Every day | ORAL | 6 refills | Status: DC

## 2017-04-19 MED ORDER — LISINOPRIL 5 MG PO TABS: 5.0000 mg | ORAL_TABLET | Freq: Every day | ORAL | 4 refills | Status: DC

## 2017-04-19 MED ORDER — ISOSORBIDE MONONITRATE ER 30 MG PO TB24: 30.0000 mg | ORAL_TABLET | Freq: Every day | ORAL | 9 refills | Status: DC

## 2017-04-19 MED ORDER — METOPROLOL TARTRATE 25 MG PO TABS: 25.0000 mg | ORAL_TABLET | Freq: Two times a day (BID) | ORAL | 3 refills | Status: AC

## 2017-04-19 NOTE — Patient Instructions (Signed)
No changes were made today in your therapy . Your prescriptions has been printed out for you to take with you when you move to [place] until you can get  Medical care established there.  GOOD LUCK WITH YOUR MOVE!  Dr Claiborne Billings and  staff will be available if you return and need assistance. We enjoyed having you as our patient.

## 2017-04-19 NOTE — Progress Notes (Signed)
Patient ID: Jeffery Hutchinson, male   DOB: August 12, 1941, 76 y.o.   MRN: [id number]    Primary MD:  Dr. Virgina Jock  HPI: Jeffery Hutchinson is a 76 y.o. male who presents to the office today for a 9 month followup cardiology evaluation.  Jeffery Hutchinson developed 2 episodes of exertionally precipitated substernal chest tightness in 2014. The first episode occurred when he was on the elliptical exercise machine and the second at occurred [place]. When I saw him for initial cardiology evaluation on 06/13/2013 iwas concerned that his symptoms were representative of angina. I initiated medical therapy with Lopressor 25 mg twice a day as as well as isosorbide mononitrate in addition to aspirin. Because of his fairly classic exertional symptomatology I  recommended definitive cardiac catheterization.  Catheterization on 06/28/2013 revealed normal LV function. He had moderate coronary obstructive disease with 30-50% smooth narrowing in the LAD, 60-70% narrowing in the proximal circumflex with a 40-50% stenosis in the circumflex marginal vessel and the right coronary artery had 20% proximal and distal stenoses. My suspicion was that his culprit vessel was the circumflex vessel.  Since he had not had any further symptoms since initiating medical therapy, medical therapy with aggressive lipid-lowering therapy was initially recommended.  Followup laboratory with an NMR lipoprotein showed markedly improved lipid status with a total cholesterol being improved from 217 to 134, and LDL cholesterol at 151 to 66. HDL cholesterol was 55, triglyceride 66. LDL particle number was 1052. VLDL size is still slightly increased at 48.5 as was small LDL particle number slightly increased at 550. He has tolerated atorvastatin without side effects.   Over the past 2 years, Jeffery Hutchinson has continued to do well.  He remains active.  He denies recurrent chest pain.  He denies change in exercise tolerance.  He has been tolerating  his medical therapy.  He has not required any sublingual nitroglycerin use.  He has a soft carotid bruit.  He underwent carotid duplex imaging on 03/26/2014.  This revealed mild plaque without evidence for significant diameter reduction, tortuosity or other vascular abnormalities.  When I saw him last year, I was concerned about obstructive sleep apnea.  He had a history of loud snoring and witnessed apnea according to his wife.   He typically goes to bed between 11 and 11:30 PM and wakes up between 7 and 7:30 AM.  He oftentimes gets up 1-3 times per night.   A sleep study which confirmed my suspicion for sleep apnea.  This was notable for moderate obstructive sleep apnea.  Respiratory events were significant with a desaturation is 78%.  On the baseline portion of the split-night protocol.  His AHI was 28 per hour.  Overall and 23.5 per hour with REM sleep, placing him in a moderate sleep apnea category.  There was moderate snoring.  He underwent CPAP titration and a pressure of 8 cm was recommended.  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 He initiated CPAP therapy.   AHI improved to 2.5 with treatment. He then developed a cold for 3 weeks with bronchitis and consequently did not use the machine.  Subsequently, he was noncompliant during his 3 month post-initiation assessment and [facility] advised that he return his machine.  He has not been using CPAP since that time.  Presently, he feels that he is sleeping well.  He believes his sleep is restorative.  He is not aware of any snoring.  He denies daytime sleepiness.  Since I last saw him in August 2017, he has continued to remain active.  He is walking almost daily up to 2- 3 miles per day for 45-50 minutes.  He had complete set of blood work done by Dr. Virgina Jock in February 2018 and was told that his labs were excellent.  Specifically, cluster was 134, triglycerides 57, HDL 44, and LDL was 79.  BUN 13, creatinine 1.0.  TSH  0.470.  PSA was normal.  He has been very busy packing up his house.  He will be moving to [facility] next week and will be establishing medical care at his new residence.  He denies chest pain.  He denies PND, orthopnea.  He denies palpitations.  He presents for evaluation.  Past Medical History:  Diagnosis Date  . Prostate cancer ([place]) 2009    Past Surgical History:  Procedure Laterality Date  . LEFT HEART CATHETERIZATION WITH CORONARY ANGIOGRAM N/A 06/28/2013   Procedure: LEFT HEART CATHETERIZATION WITH CORONARY ANGIOGRAM;  Surgeon: Troy Sine, MD;  Location: [facility] C[facility] LAB;  Service: Cardiovascular;  Laterality: N/A;  . TONSILLECTOMY  1961    No Known Allergies  Current Outpatient Prescriptions  Medication Sig Dispense Refill  . acetaminophen (TYLENOL) 325 MG tablet Take 650 mg by mouth as needed.    Marland Kitchen aspirin 81 MG tablet Take 81 mg by mouth daily.    Marland Kitchen atorvastatin (LIPITOR) 80 MG tablet Take 1 tablet (80 mg total) by mouth daily. 30 tablet 6  . calcium-vitamin D (OSCAL WITH D) 500-200 MG-UNIT per tablet Take 1 tablet by mouth 2 (two) times daily.    . fish oil-omega-3 fatty acids 1000 MG capsule Take 1 g by mouth 2 (two) times daily.     . isosorbide mononitrate (IMDUR) 30 MG 24 hr tablet Take 1 tablet (30 mg total) by mouth daily. 30 tablet 9  . lisinopril (PRINIVIL,ZESTRIL) 5 MG tablet Take 1 tablet (5 mg total) by mouth daily. 30 tablet 4  . metoprolol tartrate (LOPRESSOR) 25 MG tablet Take 1 tablet (25 mg total) by mouth 2 (two) times daily. 60 tablet 3  . Multiple Vitamin (MULITIVITAMIN WITH MINERALS) TABS Take 1 tablet by mouth daily.    . sodium chloride (OCEAN) 0.65 % nasal spray Place 2 sprays into the nose as needed. For dry nose     No current facility-administered medications for this visit.     Social History   Social History  . Marital status: Married    Spouse name: N/A  . Number of children: N/A  . Years of education: N/A   Occupational  History  . Not on file.   Social History Main Topics  . Smoking status: Former Research scientist (life sciences)  . Smokeless tobacco: Former Systems developer    Quit date: 11/30/1981  . Alcohol use 7.2 - 8.4 oz/week    12 - 14 Standard drinks or equivalent per week  . Drug use: Unknown  . Sexual activity: Not on file   Other Topics Concern  . Not on file   Social History Narrative  . No narrative on file    Family History  Problem Relation Age of Onset  . Heart disease Mother 36  . Dementia Maternal Grandmother   . Hypertension Sister   . Heart disease Brother   . Thyroid disease Brother   . Heart failure Maternal Grandfather   . Heart disease Maternal Grandfather    ROS General: Negative; No fevers, chills, or  night sweats HEENT: Negative; No changes in vision or hearing, sinus congestion, difficulty swallowing Pulmonary: Negative; No cough, wheezing, shortness of breath, hemoptysis Cardiovascular: Negative; No chest pain, presyncope, syncope, palpatations GI: Positive for recent intermittent right upper quadrant discomfort; No nausea, vomiting, diarrhea, or abdominal pain GU: Negative; No dysuria, hematuria, or difficulty voiding Musculoskeletal: Negative; no myalgias, joint pain, or weakness Hematologic: Negative; no easy bruising, bleeding Endocrine: Negative; no heat/cold intolerance Neuro: Negative; no changes in balance, headaches Skin: Negative; No rashes or skin lesions Psychiatric: Negative; No behavioral problems, depression Sleep: positive for snoring and witnessed anea by his wife while sleeping and previous documentation of moderate obstructive sleep apnea; no daytime sleepiness, hypersomnolence, bruxism, restless legs, hypnogognic hallucinations, no cataplexy   PE BP (!) 140/100   Pulse (!) 50   Ht '5\' 9"'$  (1.753 m)   Wt 176 lb (79.8 kg)   BMI 25.99 kg/m    Repeat blood pressure by me 130/80.  Wt Readings from Last 3 Encounters:  04/19/17 176 lb (79.8 kg)  07/06/16 178 lb (80.7 kg)    06/10/15 173 lb 9.6 oz (78.7 kg)   General: Alert, oriented, no distress.  Skin: normal turgor, no rashes, warm and dry HEENT: Normocephalic, atraumatic. Pupils equal round and reactive to light; sclera anicteric; extraocular muscles intact;  Nose without nasal septal hypertrophy Mouth/Parynx benign; Mallinpatti scale2 Neck: No JVD, previously noted, soft right carotid bruit; normal carotid upstroke Lungs: clear to ausculatation and percussion; no wheezing or rales Chest wall: without tenderness to palpitation Heart: PMI not displaced, RRR, s1 s2 normal, 1/6 systolic murmur, no diastolic murmur, no rubs, gallops, thrills, or heaves Abdomen: soft, nontender; no hepatosplenomehaly, BS+; abdominal aorta nontender and not dilated by palpation. Back: no CVA tenderness Pulses 2+ Musculoskeletal: full range of motion, normal strength, no joint deformities Extremities: no clubbing cyanosis or edema, Homan's sign negative  Neurologic: grossly nonfocal; Cranial nerves grossly wnl Psychologic: Normal mood and affect   ECG (independently read by me): Sinus bradycardia at 50 bpm, wandering baseline.  Borderline first-degree AV block with a PR interval at 208 ms.  No ST segment changes.  August 2017 ECG (independently read by me): Sinus bradycardia with mild sinus arrhythmia at 57 bpm.  Borderline first-degree AV block with a PR interval of 212 ms.  QTc interval normal  July 2016 ECG (independently read by me): Sinus bradycardia with sinus arrhythmia with a ventricular rate in the upper 40s to 50 range.  First-degree AV block with a PR interval at 238 ms.  No ST segment changes.  ECG (independently read by me):  Sinus bradycardia 54 bpm with mild sinus arrhythmia.  Normal intervals.  No ST segment changes.  Prior April 2015 ECG (independently read by me):  Normal sinus rhythm at 61 beats per minute  Prior 09/26/2013 ECG: Sinus rhythm 59 beats per minute. No ST changes. Early transition in lead  V2.  LABS:  BMP Latest Ref Rng & Units 04/05/2015 02/18/2015 10/08/2014  Glucose 70 - 99 mg/dL 97 112(H) 100(H)  BUN 6 - 23 mg/dL '14 15 15  '$ Creatinine 0.50 - 1.35 mg/dL 1.00 0.97 1.08  Sodium 135 - 145 mEq/L 142 141 140  Potassium 3.5 - 5.3 mEq/L 4.5 4.4 4.8  Chloride 96 - 112 mEq/L 104 105 105  CO2 19 - 32 mEq/L 32 28 30  Calcium 8.4 - 10.5 mg/dL 9.3 9.5 9.1   Hepatic Function Latest Ref Rng & Units 04/05/2015 10/08/2014 08/21/2013  Total Protein 6.0 - 8.3  g/dL 6.8 6.4 7.2  Albumin 3.5 - 5.2 g/dL 4.0 3.9 4.4  AST 0 - 37 U/L '23 22 26  '$ ALT 0 - 53 U/L '22 20 24  '$ Alk Phosphatase 39 - 117 U/L 75 74 66  Total Bilirubin 0.2 - 1.2 mg/dL 0.5 0.5 0.7  \ CBC Latest Ref Rng & Units 10/08/2014 06/23/2013 08/10/2008  WBC 4.0 - 10.5 K/uL 4.3 3.7(L) -  Hemoglobin 13.0 - 17.0 g/dL 13.4 13.8 13.9  Hematocrit 39.0 - 52.0 % 39.0 40.1 41.0  Platelets 150 - 400 K/uL 286 248 -   Lab Results  Component Value Date   MCV 90.3 10/08/2014   MCV 91.8 06/23/2013   MCV 96.7 08/10/2008   Lab Results  Component Value Date   TSH 0.435 10/08/2014   No results found for: HGBA1C  Lipid Panel     Component Value Date/Time   CHOL 127 04/05/2015 0811   CHOL 123 10/08/2014 0935   TRIG 56 04/05/2015 0811   TRIG 69 10/08/2014 0935   HDL 50 04/05/2015 0811   HDL 52 10/08/2014 0935   CHOLHDL 2.5 04/05/2015 0811   VLDL 11 04/05/2015 0811   LDLCALC 66 04/05/2015 0811   LDLCALC 57 10/08/2014 0935     RADIOLOGY: No results found.   IMPRESSION:  1. Coronary artery disease due to lipid rich plaque   2. Essential hypertension   3. OSA (obstructive sleep apnea)   4. Right carotid bruit      ASSESSMENT AND PLAN: Jeffery Hutchinson is a 42 -year-old Caucasian male who has documented moderate coronary disease by cardiac catheterization on 06/28/2013.Marland Kitchen Previously, he had experienced 2 episodes of chest pain both from with moderate to more intense exertion. He has not had any recurrent chest pain since initiating medical  therapy consisting of metoprolol 25 mg twice a day and isosorbide mononitrate at 30 mg.  There have been no further anginal symptoms.  He is tolerating atorvastatin 80 mg for hyperlipidemia with hopeful plaque regression.  Target LDL is less than 70.  His most recent laboratory has shown his LDL at 79.    He is on aspirin 81 mg for antiplatelet benefit.  He also has been taking fish oil.  He has a soft carotid bruit and prior carotid studies did not reveal hemodynamically significant plaque, although mild fibrous plaque was noted bilaterally.  His ECG remains stable.  He is bradycardic with mild sinus arrhythmia and first-degree heart block which is unchanged from previously.  His blood pressure was elevated when he walked in and his blood pressure was immediately taken by the nurse.  However, when rechecked by me blood pressure was 130/80.  When he saw Dr. Virgina Jock in March 2018, blood pressure was 132/75 He has been diagnosed with sleep apnea in the past.  He remains asymptomatic, not on CPAP.  He will be moving to [place] next [place].  I have renewed all his prescriptions and have given him a paper prescription to take with him.  I will be available in the future if he comes back to the [place] area.  I wiil be happy to send records to his new physicians when he establishes care.   Time spent: 25 minutes  Troy Sine, MD, [facility]  04/19/2017 10:10 AM

## 2017-08-30 ENCOUNTER — Other Ambulatory Visit: Payer: Self-pay | Admitting: Cardiovascular Disease

## 2017-08-30 NOTE — Telephone Encounter (Signed)
REFILL 

## 2017-09-12 ENCOUNTER — Other Ambulatory Visit: Payer: Self-pay | Admitting: Cardiovascular Disease

## 2018-03-29 ENCOUNTER — Other Ambulatory Visit: Payer: Self-pay | Admitting: Cardiovascular Disease

## 2018-03-29 NOTE — Telephone Encounter (Signed)
Rx(s) sent to pharmacy electronically.  

## 2018-04-28 ENCOUNTER — Other Ambulatory Visit: Payer: Self-pay

## 2018-05-03 ENCOUNTER — Other Ambulatory Visit: Payer: Self-pay | Admitting: Cardiovascular Disease

## 2018-05-26 ENCOUNTER — Other Ambulatory Visit: Payer: Self-pay | Admitting: Cardiovascular Disease

## 2018-11-09 ENCOUNTER — Other Ambulatory Visit: Payer: Self-pay | Admitting: Cardiovascular Disease

## 2018-11-09 NOTE — Telephone Encounter (Signed)
Rx sent to pharmacy   

## 2018-12-03 ENCOUNTER — Other Ambulatory Visit: Payer: Self-pay | Admitting: Cardiovascular Disease
